# Patient Record
Sex: Female | Born: 1960 | Race: White | Hispanic: No | Marital: Married | Smoking: Never smoker
Health system: Southern US, Community
[De-identification: ages and names within clinical notes are randomized; demographics above are authoritative.]

## PROBLEM LIST (undated history)

## (undated) DIAGNOSIS — Z9889 Other specified postprocedural states: Secondary | ICD-10-CM

## (undated) DIAGNOSIS — T4145XA Adverse effect of unspecified anesthetic, initial encounter: Secondary | ICD-10-CM

## (undated) DIAGNOSIS — R112 Nausea with vomiting, unspecified: Secondary | ICD-10-CM

## (undated) DIAGNOSIS — F419 Anxiety disorder, unspecified: Secondary | ICD-10-CM

## (undated) DIAGNOSIS — R55 Syncope and collapse: Secondary | ICD-10-CM

## (undated) DIAGNOSIS — C801 Malignant (primary) neoplasm, unspecified: Secondary | ICD-10-CM

## (undated) DIAGNOSIS — Z87442 Personal history of urinary calculi: Secondary | ICD-10-CM

## (undated) DIAGNOSIS — J302 Other seasonal allergic rhinitis: Secondary | ICD-10-CM

## (undated) DIAGNOSIS — T8859XA Other complications of anesthesia, initial encounter: Secondary | ICD-10-CM

## (undated) DIAGNOSIS — E162 Hypoglycemia, unspecified: Secondary | ICD-10-CM

## (undated) DIAGNOSIS — S29011A Strain of muscle and tendon of front wall of thorax, initial encounter: Secondary | ICD-10-CM

## (undated) DIAGNOSIS — M8580 Other specified disorders of bone density and structure, unspecified site: Secondary | ICD-10-CM

## (undated) HISTORY — DX: Other seasonal allergic rhinitis: J30.2

## (undated) HISTORY — DX: Malignant (primary) neoplasm, unspecified: C80.1

## (undated) HISTORY — DX: Other specified disorders of bone density and structure, unspecified site: M85.80

## (undated) HISTORY — PX: NASAL SEPTUM SURGERY: SHX37

## (undated) HISTORY — PX: SHOULDER ARTHROSCOPY: SHX128

---

## 1998-05-28 ENCOUNTER — Emergency Department (HOSPITAL_COMMUNITY): Admission: EM | Admit: 1998-05-28 | Discharge: 1998-05-28 | Payer: Self-pay | Admitting: Emergency Medicine

## 2000-09-17 ENCOUNTER — Other Ambulatory Visit: Admission: RE | Admit: 2000-09-17 | Discharge: 2000-09-17 | Payer: Self-pay | Admitting: Gynecology

## 2000-10-23 ENCOUNTER — Encounter: Payer: Self-pay | Admitting: Emergency Medicine

## 2000-10-23 ENCOUNTER — Emergency Department (HOSPITAL_COMMUNITY): Admission: EM | Admit: 2000-10-23 | Discharge: 2000-10-23 | Payer: Self-pay | Admitting: Emergency Medicine

## 2000-10-31 ENCOUNTER — Ambulatory Visit (HOSPITAL_COMMUNITY): Admission: RE | Admit: 2000-10-31 | Discharge: 2000-10-31 | Payer: Self-pay | Admitting: Neurology

## 2001-01-11 ENCOUNTER — Ambulatory Visit (HOSPITAL_COMMUNITY): Admission: RE | Admit: 2001-01-11 | Discharge: 2001-01-11 | Payer: Self-pay | Admitting: Neurology

## 2001-01-11 ENCOUNTER — Encounter: Payer: Self-pay | Admitting: Neurology

## 2001-05-20 ENCOUNTER — Emergency Department (HOSPITAL_COMMUNITY): Admission: EM | Admit: 2001-05-20 | Discharge: 2001-05-20 | Payer: Self-pay | Admitting: Emergency Medicine

## 2001-05-20 ENCOUNTER — Encounter: Payer: Self-pay | Admitting: Emergency Medicine

## 2002-01-01 ENCOUNTER — Encounter: Payer: Self-pay | Admitting: Family Medicine

## 2002-01-01 ENCOUNTER — Ambulatory Visit (HOSPITAL_COMMUNITY): Admission: RE | Admit: 2002-01-01 | Discharge: 2002-01-01 | Payer: Self-pay | Admitting: Family Medicine

## 2002-03-23 ENCOUNTER — Other Ambulatory Visit: Admission: RE | Admit: 2002-03-23 | Discharge: 2002-03-23 | Payer: Self-pay | Admitting: Gynecology

## 2002-03-27 ENCOUNTER — Encounter: Admission: RE | Admit: 2002-03-27 | Discharge: 2002-03-27 | Payer: Self-pay | Admitting: Gynecology

## 2002-03-27 ENCOUNTER — Encounter: Payer: Self-pay | Admitting: Gynecology

## 2002-09-13 ENCOUNTER — Emergency Department (HOSPITAL_COMMUNITY): Admission: EM | Admit: 2002-09-13 | Discharge: 2002-09-13 | Payer: Self-pay | Admitting: Emergency Medicine

## 2002-09-13 ENCOUNTER — Encounter: Payer: Self-pay | Admitting: Emergency Medicine

## 2003-05-19 ENCOUNTER — Other Ambulatory Visit: Admission: RE | Admit: 2003-05-19 | Discharge: 2003-05-19 | Payer: Self-pay | Admitting: Gynecology

## 2003-05-24 ENCOUNTER — Encounter: Payer: Self-pay | Admitting: Gynecology

## 2003-05-24 ENCOUNTER — Encounter: Admission: RE | Admit: 2003-05-24 | Discharge: 2003-05-24 | Payer: Self-pay | Admitting: Gynecology

## 2004-09-01 ENCOUNTER — Other Ambulatory Visit: Admission: RE | Admit: 2004-09-01 | Discharge: 2004-09-01 | Payer: Self-pay | Admitting: Gynecology

## 2005-12-07 ENCOUNTER — Encounter: Admission: RE | Admit: 2005-12-07 | Discharge: 2005-12-07 | Payer: Self-pay | Admitting: Orthopedic Surgery

## 2006-10-01 HISTORY — PX: ABDOMINAL HYSTERECTOMY: SHX81

## 2006-10-11 ENCOUNTER — Encounter: Admission: RE | Admit: 2006-10-11 | Discharge: 2006-10-11 | Payer: Self-pay | Admitting: Obstetrics and Gynecology

## 2007-01-01 ENCOUNTER — Encounter (INDEPENDENT_AMBULATORY_CARE_PROVIDER_SITE_OTHER): Payer: Self-pay | Admitting: Specialist

## 2007-01-01 ENCOUNTER — Inpatient Hospital Stay (HOSPITAL_COMMUNITY): Admission: RE | Admit: 2007-01-01 | Discharge: 2007-01-02 | Payer: Self-pay | Admitting: Obstetrics and Gynecology

## 2007-03-08 ENCOUNTER — Encounter: Admission: RE | Admit: 2007-03-08 | Discharge: 2007-03-08 | Payer: Self-pay | Admitting: Orthopedic Surgery

## 2008-10-01 HISTORY — PX: INCONTINENCE SURGERY: SHX676

## 2009-04-12 ENCOUNTER — Ambulatory Visit (HOSPITAL_BASED_OUTPATIENT_CLINIC_OR_DEPARTMENT_OTHER): Admission: RE | Admit: 2009-04-12 | Discharge: 2009-04-12 | Payer: Self-pay | Admitting: Urology

## 2009-05-24 ENCOUNTER — Encounter: Admission: RE | Admit: 2009-05-24 | Discharge: 2009-05-24 | Payer: Self-pay | Admitting: Obstetrics and Gynecology

## 2010-10-23 ENCOUNTER — Encounter (HOSPITAL_COMMUNITY): Payer: Self-pay | Admitting: Obstetrics and Gynecology

## 2011-01-07 LAB — TYPE AND SCREEN: ABO/RH(D): O POS

## 2011-01-07 LAB — PROTIME-INR
INR: 1 (ref 0.00–1.49)
Prothrombin Time: 13.4 seconds (ref 11.6–15.2)

## 2011-01-07 LAB — CBC
HCT: 38.8 % (ref 36.0–46.0)
MCHC: 34.7 g/dL (ref 30.0–36.0)
RBC: 3.97 MIL/uL (ref 3.87–5.11)

## 2011-01-07 LAB — APTT: aPTT: 30 seconds (ref 24–37)

## 2011-02-13 NOTE — Op Note (Signed)
NAMECORINN, Elizabeth Dominguez             ACCOUNT NO.:  [id number]   MEDICAL RECORD NO.:  [id number]          PATIENT TYPE:  AMB   LOCATION:  NESC                         FACILITY:  [facility]   PHYSICIAN:  Martina Sinner, MD DATE OF BIRTH:  02/24/1961   DATE OF PROCEDURE:  04/12/2009  DATE OF DISCHARGE:  04/12/2009                               OPERATIVE REPORT   PREOPERATIVE DIAGNOSIS:  Stress urinary continence.   POSTOPERATIVE DIAGNOSIS:  Stress urinary continence.   SURGERY:  Sling cystourethropexy [facility]) plus cystoscopy.   Elizabeth Dominguez has stress incontinence and consented to a sling.  The  risks were discussed.  Preoperative lab tests were normal.  Extra care  was taken with leg positioning.   Two 1-cm incisions were made; 1-cm incisions were made 1 fingerbreadth  above the symphysis pubis 1.5-cm lateral to the midline.  A 2-cm  incision was made underlying the mid urethra.  I instilled 7 mL of  lidocaine epinephrine mixture.  I sharply dissected urethrovesical angle  bilaterally.  With the bladder empty, I passed a SPARC needle on top of  and along the back of the symphysis pubis around the pulp of my index  finger bilaterally.   I cystoscoped the patient.  There was excellent jets.  There was no  indentation of bladder with deflection of the trocars.  There was no  injury to bladder or urethra.   With the bladder empty, I attached a SPARC sling and brought up through  the retropubic space.  I tensioned over the fat part of moderate-sized  Kelly clamp.  I cut below the blue dots and removed the sheath.  I was  having position and tension of the sling with no spring-back effect.  There was nice hypermobility.  A 2-0 Vicryl was used in running fashion  followed by 2 interrupted sutures.  Sling was cut at the level of the  skin, and 4-0 Vicryl followed by Dermabond was utilized.  Hopefully,  this operation reaches the patient's treatment goals.  I was very  pleased with  procedure.           ______________________________  Martina Sinner, MD  Electronically Signed     SAM/MEDQ  D:  04/21/2009  T:  04/21/2009  Job:  [id number]

## 2011-02-16 NOTE — Op Note (Signed)
Elizabeth Dominguez, Elizabeth Dominguez             ACCOUNT NO.:  [id number]   MEDICAL RECORD NO.:  [id number]          PATIENT TYPE:  INP   LOCATION:  9302                          FACILITY:  [facility]   PHYSICIAN:  Zelphia Cairo, MD    DATE OF BIRTH:  [birth year]   DATE OF PROCEDURE:  01/01/2007  DATE OF DISCHARGE:  01/02/2007                               OPERATIVE REPORT   PREOPERATIVE DIAGNOSES:  1. Menorrhagia.  2. Cystocele.  3. Rectocele.   POSTOPERATIVE DIAGNOSES:  1. Menorrhagia.  2. Cystocele.  3. Rectocele.   PROCEDURE:  LAVH with anterior and posterior repair.   SURGEON:  Zelphia Cairo, M.D.   ASSISTANT:  Dineen Kid. Rana Snare, M.D.   ANESTHESIA:  General.   SPECIMEN:  Uterus and cervix.   ESTIMATED BLOOD LOSS:  350 mL.   COMPLICATIONS:  None.   URINE OUTPUT:  800 mL.   CONDITION:  Stable and extubated to recovery room.   PROCEDURE:  The patient was taken to the operating room where she was  given general anesthesia.  She was placed in the dorsal lithotomy  position using Allen stirrups.  She was prepped and draped in sterile  fashion, and a Foley catheter was inserted in the bladder.   A bivalve speculum was placed in the vagina and a single-tooth tenaculum  was placed on the anterior lip of the cervix.  A Hulka clamp was  inserted into the uterine cavity and grasped the anterior lip of the  cervix.  The single-tooth tenaculum and speculum were then removed, and  our focus was turned to the abdomen.   A small infraumbilical skin incision was made with the scalpel, and a  blunt trocar was inserted under direct visualization.  Once  intraperitoneal placement was confirmed, the CO2 was turned on and the  abdomen and pelvis were insufflated.  A survey of the abdomen and pelvis  revealed a boggy-appearing uterus, normal-appearing adnexa bilaterally.  The patient was placed in Trendelenburg.  A small 5-mm suprapubic  incision was made with a scalpel, and a 5 mm trocar was inserted  under  direct visualization.  A blunt probe was placed through the 5-mm trocar,  and the bowel was swept out of the cul-de-sac.  The cul-de-sac was free  of any adhesions.  The uterus was then manipulated to one side.  The  right adnexa was grasped and tented up and outward using a blunt  grasper.  The gyrus was used to cauterize and cut the fallopian tube and  utero-ovarian ligament.  Hemostasis was assured.  Serial bites were then  taken down the broad ligament staying just adjacent to the uterus.  The  round ligament was then grasped, cauterized and cut with the gyrus.  Excellent hemostasis was noted.  This procedure was repeated on the  left.  Once hemostasis was assured bilaterally, all instruments were  removed from the abdomen.  The CO2 was turned off, and our attention was  focused on the vagina.   A weighted speculum was placed in the posterior cul-de-sac, a Deaver in  the anterior cul-de-sac, and a single-tooth tenaculum was  used to grasp  the cervix.  A circumferential incision was made around the cervix using  the Bovie.  The posterior cul-de-sac was then entered sharply using Mayo  scissors, and the weighted speculum was placed into the posterior cul-de-  sac.  At this point, the uterosacral ligaments were grasped with the  LigaSure, cauterized, and cut with Mayo scissors.  This procedure was  performed bilaterally.  The bladder was then dissected off of the  pubocervical fascia anteriorly using a 4 x 4 sponge, and the anterior  cul-de-sac was entered sharply using Mayo scissors.  The Deaver was then  placed for retraction in the anterior cul-de-sac.  The uterine arteries  and broad ligaments were then serially clamped using LigaSure,  cauterized, and cut with Mayo scissors.  Hemostasis was assured  bilaterally.  The uterus was then grasped at the fundus using a thyroid  tenaculum and delivered posteriorly through the cul-de-sac.  Bilateral  cornua were then clamped and  cauterized using the LigaSure and then cut  with Mayo scissors.  The uterus was then passed off and sent to  pathology.  The uterosacral ligaments were then reapproximated to  obliterate the cul-de-sac.  The vaginal cuff was then closed vertically  using figure-of-eight stitches of 0 Vicryl.  Once hemostasis was  assured, our attention was then turned to the anterior vagina and  cystocele repair.   An incision was made in the anterior vagina, and the vaginal mucosa was  dissected free of the underlying pubocervical fascia bluntly and using  Metzenbaum scissors.  The fascia was then reapproximated with Vicryl  using interrupted stitches.  The excess vaginal mucosa was then trimmed,  and the anterior vagina was closed using Vicryl in a running locked  fashion.  Our attention was then turned to the posterior vagina.  Two  Allis clamps were placed on the perineal body.  A V-shaped incision was  made using the scalpel.  The vaginal mucosa was then incised in the  midline extending up to the vaginal cuff.  The underlying fascia was  dissected off the vaginal mucosa bluntly and using Metzenbaum scissors.  The fascia was reapproximated using interrupted suture.  The excess  vaginal mucosa was trimmed, and the vaginal mucosa was closed in a  running locked fashion.  Following the procedure, vaginal hemostasis was  assured and our attention was then returned to the abdomen.   The abdomen was reinsufflated.  The camera was inserted, and the abdomen  and pelvis were irrigated.  Once hemostasis was assured by inspecting  all pedicles, all instruments were removed from the abdomen.  The  abdominal incisions were closed using 3-0 Vicryl.   The patient was extubated and taken to the recovery room in stable  condition.      Zelphia Cairo, MD  Electronically Signed     GA/MEDQ  D:  01/06/2007  T:  01/06/2007  Job:  [id number]

## 2011-02-16 NOTE — H&P (Signed)
Elizabeth Dominguez, UNGERER             ACCOUNT NO.:  [id number]   MEDICAL RECORD NO.:  [id number]          PATIENT TYPE:  INP   LOCATION:  9302                          FACILITY:  [facility]   PHYSICIAN:  Zelphia Cairo, MD    DATE OF BIRTH:  01-25-1961   DATE OF ADMISSION:  01/01/2007  DATE OF DISCHARGE:                              HISTORY & PHYSICAL   A 50-year white female presents to the office in December with  complaints of heavy menstrual cycles over the last 5-6 months. She has  noticed increasing menses with large blood clots.  Also increasing  dysmenorrhea and back pain with her periods.  She will have occasional  spotting between periods.  She reports that her menstrual cycles occur  every 2-3 weeks.   PAST MEDICAL HISTORY:  Seasonal disorder.   MEDICATIONS:  Allegra DS, Topamax 200 mg per day.   SURGICAL HISTORY:  Two nasal surgeries.   SOCIAL HISTORY:  Negative for tobacco, alcohol and drug use.   ALLERGIES:  Environmental.   OB HISTORY:  One vaginal delivery.  Her daughter is now 52 years old.  GYN history negative for abnormal Pap smears.  Her last mammogram was 2  years ago.  She is sexually active and denies pain with intercourse.   FAMILY HISTORY:  Her mom was diagnosed with breast cancer in her 61s.  She has had a complete mastectomy.  She is now living at the age of 50.  She denies other GYN or colon cancers.   PHYSICAL EXAM:  Height 5 feet 9 inches, weight 162, blood pressure  108/68, hemoglobin 14.8.  Urine negative.  Head, neck normal.  No  thyromegaly or nodularity.  HEART:  Regular.  Lungs:  Clear.  ABDOMEN:  Soft, nontender, no masses palpated.  PELVIC EXAM:  Reveals normal  external female genitalia.  Vagina and cervix are normal.  No lesions.  Uterus is mobile and nontender.  No adnexal masses are noted.  Rectal  exam negative.   Sonohystogram was performed 10/17/06 showing retroverted uterus with  possible adenomyosis after saline infusion over the 3 mm  thickened  endometrial lining but no intracavitary masses were noted.  These  results were discussed with the patient.  We discussed options including  minor IUD versus endometrial ablation versus hysterectomy.  The patient  opts for hysterectomy.  Risks, benefits and alternatives were discussed  with the patient and informed consent was obtained.      Zelphia Cairo, MD  Electronically Signed    GA/MEDQ  D:  12/31/2006  T:  01/01/2007  Job:  [id number]

## 2011-02-16 NOTE — Consult Note (Signed)
[facility]  Patient:    KORISSA, HORSFORD                   MRN: [id number] Proc. Date: 10/23/00 Attending:  Marlan Palau, M.D. CC:         Willis Modena. Dreiling, M.D.   Consultation Report  HISTORY OF PRESENT ILLNESS:  Elizabeth Dominguez is a 50 year old right-handed white female, born [birth year], with a history of headaches and dizzy episodes.  This patient has been seen previously by Dr. 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  An MR angiogram showed two questionable small areas of signal loss in left vertebral and mid-basilar arteries; these were felt possibly to represent artifact.  Patient was felt possibly to have migraine, was placed on Calan for a period of time, but has been taken off of this. Patient has done well with her headaches and has not had significant problems with this.  Three weeks ago, patient had a syncopal episode that occurred while at home.  Patient was noted to have blanching of the face, felt dizzy, light-headed, hot and cold at the same time, with clammy skin.  Patient felt as if she were going to black out and actually did pass out for a period of time, had some nausea afterwards and took about 30 minutes to get over the episode.  Patient felt as if her heart was racing.  Patient had a recurring episode today that lasted on a few seconds with the episode of syncope. Patient actually was able to get down on the floor but still blacked out. Patient denied any nausea today but felt shortness of breath when she came to on both occasions.  Patient denied any overt chest pain.  Patient feels as if the right face and arm may be tingly following the episodes.  Patient also had some slight headache about two minutes prior to this syncopal event.  Patient may see white spots in front of the eyes as well.  Patient was brought to the emergency room  for an evaluation.  CT scan of the brain done through the emergency room shows no acute changes.  Some minimal changes in the left cerebellar hemisphere are noted.  Neurology was called for further evaluation.  PAST MEDICAL HISTORY 1. History of syncope, as above. 2. History of headache, possibly migraine. 3. History of allergies.  PAST SURGICAL HISTORY:  No surgical history noted.  CURRENT MEDICATIONS:  Patient is currently on Allegra.  ALLERGIES:  Has no known allergies.  SOCIAL HISTORY:  Does not smoke.  Drinks alcohol on occasion.  Patient is married, works as an Producer, television/film/video.  FAMILY MEDICAL HISTORY:  Notable that mother has history of hypertension. Father has Parkinsons disease.  There is Parkinsons in the grandfather. Patient has two brothers and two sisters and two children of her own.  Mother, grandfather and uncle have hypertension and diabetes in a sister; patient, herself, has history of low sugar episodes.  Heart disease in grandfather. Cancer in her mother.  Family history is negative for faints and seizures.  REVIEW OF SYSTEMS:  No recent fevers or chills.  Patient does have occasional headaches.  Does not some occasional shortness of breath and chest pains. Denies any abdominal pains, problems controlling the bowels or bladder. Denies any tongue-biting with the above episodes.  PHYSICAL EXAMINATION  VITALS:  Blood pressure is 114/61.  Heart rate 66 and regular.  Respiratory rate 20.  Temperature:  Afebrile.  GENERAL:  This patient is a minimally obese white female who is alert and cooperative at the time of examination.  HEENT:  Head is atraumatic.  Eyes:  Pupils are equal, round and reactive to light.  Disks are flat bilaterally.  NECK:  Supple.  No carotid bruits are noted.  RESPIRATORY:  Clear to auscultation and percussion.  CARDIOVASCULAR:  Examination reveals a regular rate and rhythm without obvious murmurs or  rubs.  EXTREMITIES:  The extremities are without significant edema.  NEUROLOGIC:  Cranial nerves as above.  Facial symmetry is present.  Patient has good strength to facial muscles and the muscles with head turning and shoulder shrugs bilaterally.  Visual fields are full to double simultaneous stimuli and speech is well-enunciated and not aphasic.  Motor testing reveals 5/5 strength in all fours.  Good symmetric motor tone is noted throughout. Sensory testing is intact to pinprick, soft touch and vibratory sensation throughout.  Deep tendon reflexes are symmetric and normal.  Toes are downgoing bilaterally.  Patient has good and normal finger-to-nose-to-finger and toe-to-finger bilaterally.  Patient was not ambulated.  LABORATORY AND IMAGING FINDINGS:  CT scan of the head done today shows no acute abnormalities.  Some chronic left cerebellar low densities are noted.  Some evidence of a small air-fluid level on the right maxillary sinus is noted.  EKG reveals sinus bradycardia; otherwise, normal EKG.  Heart rate of 57.  IMPRESSION 1. History of recurrent syncope. 2. Possible migraine headache.  This patient has what sounds like a typical simple faint or vasovagal response episode with light-headed sensation, brief syncope, clammy skin, observed blanching of the face, nausea associated with the event and shortness of breath.  This patient likely is having benign episodes but will pursue further followup to exclude the possibility of seizures.  Patient may benefit also from a Holter monitor study.  If the episodes continue, full workup with tilt table testing for neurocardiogenic syncope needs to be entertained.  PLAN 1. EEG studies as an outpatient. 2. Holter monitor study. 3. Consider repeat MRI and MR angiogram in the future. 4. I will have patient follow up through [facility] in the    next two to three weeks. DD:  10/23/00 TD:  10/24/00 Job:  [id number]

## 2011-10-02 HISTORY — PX: HERNIA REPAIR: SHX51

## 2011-10-05 ENCOUNTER — Ambulatory Visit (INDEPENDENT_AMBULATORY_CARE_PROVIDER_SITE_OTHER): Payer: BC Managed Care – PPO

## 2011-10-05 DIAGNOSIS — K429 Umbilical hernia without obstruction or gangrene: Secondary | ICD-10-CM

## 2011-10-15 ENCOUNTER — Ambulatory Visit (INDEPENDENT_AMBULATORY_CARE_PROVIDER_SITE_OTHER): Payer: BC Managed Care – PPO | Admitting: Surgery

## 2011-10-15 ENCOUNTER — Encounter (INDEPENDENT_AMBULATORY_CARE_PROVIDER_SITE_OTHER): Payer: Self-pay | Admitting: Surgery

## 2011-10-15 VITALS — BP 132/96 | HR 68 | Temp 98.2°F | Resp 12 | Ht 69.0 in | Wt 201.6 lb

## 2011-10-15 DIAGNOSIS — K420 Umbilical hernia with obstruction, without gangrene: Secondary | ICD-10-CM | POA: Insufficient documentation

## 2011-10-15 NOTE — Progress Notes (Signed)
Patient ID: Elizabeth Dominguez, female   DOB: 07/11/1961, 51 y.o.   MRN: [id number]  Chief Complaint  Patient presents with  . Other    new pt- eval umb hernia    HPI Elizabeth Dominguez is a 51 y.o. female.   HPIThe patient presents at the request of Dr. Perrin Maltese due to umbilical hernia. She was lifting about a month ago and developed periumbilical pain and a bulge at her umbilicus. The pain is made worse with lifting or straining. It is made better by rest. She has had diarrhea. She has had no constipation, nausea or vomiting. The pain is severe with lifting a 7/10 and sharp with radiation toward the right lower quadrant  Past Medical History  Diagnosis Date  . Cancer     skin  . Hernia     Past Surgical History  Procedure Date  . Nasal septum surgery 1987, 1998  . Abdominal hysterectomy 2008  . Shoulder arthroscopy     left  . Incontinence surgery 2010    Family History  Problem Relation Age of Onset  . Cancer Mother     breast  . Hypertension Mother   . Cancer Father     skin  . Heart disease Maternal Grandfather     Social History History  Substance Use Topics  . Smoking status: Never Smoker   . Smokeless tobacco: Not on file  . Alcohol Use: Yes    Allergies  Allergen Reactions  . Nsaids     Stomach pain    No current outpatient prescriptions on file.    Review of Systems Review of Systems  Constitutional: Negative.   HENT: Negative.   Eyes: Negative.   Respiratory: Negative.   Cardiovascular: Negative.   Gastrointestinal: Positive for abdominal pain.  Genitourinary: Negative.   Musculoskeletal: Negative.   Skin: Negative.   Neurological: Negative.   Hematological: Negative.   Psychiatric/Behavioral: Negative.     Blood pressure 132/96, pulse 68, temperature 98.2 F (36.8 C), temperature source Temporal, resp. rate 12, height 5\' 9"  (1.753 m), weight 201 lb 9.6 oz (91.445 kg).  Physical Exam Physical Exam  Constitutional: She appears  well-developed and well-nourished.  HENT:  Head: Normocephalic and atraumatic.  Eyes: EOM are normal. Pupils are equal, round, and reactive to light.  Neck: Normal range of motion. Neck supple.  Cardiovascular: Normal rate and regular rhythm.   Pulmonary/Chest: Effort normal and breath sounds normal.  Abdominal: Soft. She exhibits no distension.       A small umbilical hernia tender with incarcerated preperitoneal fat  Musculoskeletal: Normal range of motion.  Neurological: She is alert.  Skin: Skin is warm and dry.  Psychiatric: She has a normal mood and affect. Her behavior is normal. Judgment and thought content normal.    Data Reviewed Dr Perrin Maltese notes  Assessment    Umbilical hernia incarcerated    Plan    I recommend repair of her umbilical hernia. Nonoperative options discussed as well. Risks, benefits and alternative therapies discussed at each. She wishes to proceed.The risk of hernia repair include bleeding,  Infection,   Recurrence of the hernia,  Mesh use, chronic pain,  Organ injury,  Bowel injury,  Bladder injury,   nerve injury with numbness around the incision,  Death,  and worsening of preexisting  medical problems.  The alternatives to surgery have been discussed as well..  Long term expectations of both operative and non operative treatments have been discussed.   The patient agrees to  proceed.       Calle Schader A. 10/15/2011, 4:01 PM

## 2011-10-15 NOTE — Patient Instructions (Signed)
No lifting until surgery.

## 2011-10-17 ENCOUNTER — Other Ambulatory Visit (INDEPENDENT_AMBULATORY_CARE_PROVIDER_SITE_OTHER): Payer: Self-pay | Admitting: Surgery

## 2011-10-17 ENCOUNTER — Ambulatory Visit
Admission: RE | Admit: 2011-10-17 | Discharge: 2011-10-17 | Disposition: A | Discharge status: Home / Self Care | Payer: BC Managed Care – PPO | Source: Ambulatory Visit | Attending: Surgery | Admitting: Surgery

## 2011-10-17 DIAGNOSIS — K429 Umbilical hernia without obstruction or gangrene: Secondary | ICD-10-CM

## 2011-10-17 DIAGNOSIS — Z01811 Encounter for preprocedural respiratory examination: Secondary | ICD-10-CM

## 2011-10-18 DIAGNOSIS — K420 Umbilical hernia with obstruction, without gangrene: Secondary | ICD-10-CM

## 2011-10-18 HISTORY — PX: UMBILICAL HERNIA REPAIR: SHX196

## 2011-10-26 ENCOUNTER — Telehealth (INDEPENDENT_AMBULATORY_CARE_PROVIDER_SITE_OTHER): Payer: Self-pay | Admitting: Surgery

## 2011-10-29 NOTE — Telephone Encounter (Signed)
APPOINTMENT DATE IS OK IF PATIENT IS DOING OK/GY

## 2011-11-09 ENCOUNTER — Ambulatory Visit (INDEPENDENT_AMBULATORY_CARE_PROVIDER_SITE_OTHER): Payer: BC Managed Care – PPO | Admitting: Surgery

## 2011-11-09 ENCOUNTER — Encounter (INDEPENDENT_AMBULATORY_CARE_PROVIDER_SITE_OTHER): Payer: Self-pay | Admitting: Surgery

## 2011-11-09 VITALS — BP 122/86 | HR 64 | Temp 97.4°F | Resp 16 | Ht 69.0 in | Wt 200.8 lb

## 2011-11-09 DIAGNOSIS — Z9889 Other specified postprocedural states: Secondary | ICD-10-CM

## 2011-11-09 NOTE — Patient Instructions (Signed)
Resume full activity in 2 weeks.  

## 2011-11-09 NOTE — Progress Notes (Signed)
The patient returns after umbilical hernia repair. She's doing well. She has no complaints.  Exam: Umbilical incision healed well. No redness or signs of infection.  Impression: Status post repair of umbilical hernia no mesh  Plan: Return to full duty in 2 weeks followup as needed.

## 2011-12-06 ENCOUNTER — Encounter (INDEPENDENT_AMBULATORY_CARE_PROVIDER_SITE_OTHER): Payer: Self-pay | Admitting: Surgery

## 2012-12-25 ENCOUNTER — Emergency Department (HOSPITAL_COMMUNITY)
Admission: EM | Admit: 2012-12-25 | Discharge: 2012-12-25 | Disposition: A | Discharge status: Home / Self Care | Payer: Managed Care, Other (non HMO) | Attending: Emergency Medicine | Admitting: Emergency Medicine

## 2012-12-25 ENCOUNTER — Emergency Department (HOSPITAL_COMMUNITY): Payer: Managed Care, Other (non HMO)

## 2012-12-25 ENCOUNTER — Encounter (HOSPITAL_COMMUNITY): Payer: Self-pay | Admitting: Emergency Medicine

## 2012-12-25 DIAGNOSIS — Z8719 Personal history of other diseases of the digestive system: Secondary | ICD-10-CM | POA: Insufficient documentation

## 2012-12-25 DIAGNOSIS — K8021 Calculus of gallbladder without cholecystitis with obstruction: Secondary | ICD-10-CM | POA: Insufficient documentation

## 2012-12-25 DIAGNOSIS — Z85828 Personal history of other malignant neoplasm of skin: Secondary | ICD-10-CM | POA: Insufficient documentation

## 2012-12-25 DIAGNOSIS — R110 Nausea: Secondary | ICD-10-CM | POA: Insufficient documentation

## 2012-12-25 DIAGNOSIS — K8050 Calculus of bile duct without cholangitis or cholecystitis without obstruction: Secondary | ICD-10-CM

## 2012-12-25 DIAGNOSIS — Z90710 Acquired absence of both cervix and uterus: Secondary | ICD-10-CM | POA: Insufficient documentation

## 2012-12-25 LAB — COMPREHENSIVE METABOLIC PANEL
Alkaline Phosphatase: 87 U/L (ref 39–117)
Creatinine, Ser: 0.78 mg/dL (ref 0.50–1.10)
GFR calc Af Amer: >90 mL/min (ref >90)
Potassium: 3.7 mEq/L (ref 3.5–5.1)
Sodium: 136 mEq/L (ref 135–145)
Total Bilirubin: 0.6 mg/dL (ref 0.3–1.2)

## 2012-12-25 LAB — CBC WITH DIFFERENTIAL/PLATELET
Basophils Absolute: 0 10*3/uL (ref 0.0–0.1)
Eosinophils Relative: 1 % (ref 0–5)
Hemoglobin: 13.1 g/dL (ref 12.0–15.0)
Lymphocytes Relative: 16 % (ref 12–46)
Lymphs Abs: 1.3 10*3/uL (ref 0.7–4.0)
Monocytes Absolute: 0.4 10*3/uL (ref 0.1–1.0)
Monocytes Relative: 5 % (ref 3–12)
Platelets: 209 10*3/uL (ref 150–400)
WBC: 8 10*3/uL (ref 4.0–10.5)

## 2012-12-25 LAB — LIPASE, BLOOD: Lipase: 42 U/L (ref 11–59)

## 2012-12-25 MED ORDER — MORPHINE SULFATE 4 MG/ML IJ SOLN
4.0000 mg | Freq: Once | INTRAMUSCULAR | Status: AC
  Administered 2012-12-25: 4 mg via INTRAVENOUS
  Filled 2012-12-25: qty 1

## 2012-12-25 MED ORDER — OXYCODONE-ACETAMINOPHEN 5-325 MG PO TABS: 2.0000 | ORAL_TABLET | ORAL | Status: DC | PRN

## 2012-12-25 MED ORDER — ONDANSETRON HCL 4 MG/2ML IJ SOLN
4.0000 mg | Freq: Once | INTRAMUSCULAR | Status: AC
  Administered 2012-12-25: 4 mg via INTRAVENOUS
  Filled 2012-12-25: qty 2

## 2012-12-25 MED ORDER — SODIUM CHLORIDE 0.9 % IV BOLUS (SEPSIS)
1000.0000 mL | Freq: Once | INTRAVENOUS | Status: AC
  Administered 2012-12-25: 1000 mL via INTRAVENOUS

## 2012-12-25 MED ORDER — GI COCKTAIL ~~LOC~~
30.0000 mL | Freq: Once | ORAL | Status: AC
  Administered 2012-12-25: 30 mL via ORAL
  Filled 2012-12-25: qty 30

## 2012-12-25 NOTE — ED Notes (Signed)
[id number]> Expected date:<BR> Expected time:<BR> Means of arrival:<BR> Comments:<BR> abd pain

## 2012-12-25 NOTE — ED Notes (Signed)
Pt states that she ate [employer] today around noon, which she normal doesn't eat that type of food.  Pt started having mid abd pain thought it was gas but lower then where she normally has gas pains.  Pt took Pepcid but didn't help.  Rates pain 7/10.

## 2012-12-25 NOTE — ED Provider Notes (Signed)
History     CSN: [id number]  Arrival date & time 12/25/12  1340   First MD Initiated Contact with Patient 12/25/12 1341      Chief Complaint  Patient presents with  . Abdominal Pain    (Consider location/radiation/quality/duration/timing/severity/associated sxs/prior treatment) The history is provided by the patient.  Elizabeth Dominguez is a 52 y.o. female history of hysterectomy for vaginal bleeding, abdominal hernia that was repaired here presenting with abdominal pain and distention. She had [employer] around noon with fries and hamburgers. For 30 minutes later she had epigastric pain that she attributed to gas. She tried to walk around take some Pepcid but has not improved. The pain does not radiate. And she feel nauseous but she does not vomit. No fevers or chills. Came in by EMS for evaluation of abdominal pain. No urinary symptoms or constipation or diarrhea.   Past Medical History  Diagnosis Date  . Cancer     skin  . Hernia     Past Surgical History  Procedure Laterality Date  . Nasal septum surgery  1987, 1998  . Abdominal hysterectomy  2008  . Shoulder arthroscopy      left  . Incontinence surgery  2010  . Umbilical hernia repair  10/18/11    Family History  Problem Relation Age of Onset  . Cancer Mother     breast  . Hypertension Mother   . Cancer Father     skin  . Heart disease Maternal Grandfather     History  Substance Use Topics  . Smoking status: Never Smoker   . Smokeless tobacco: Not on file  . Alcohol Use: Yes    OB History   Grav Para Term Preterm Abortions TAB SAB Ect Mult Living                  Review of Systems  Gastrointestinal: Positive for nausea and abdominal pain.  All other systems reviewed and are negative.    Allergies  Nsaids  Home Medications   Current Outpatient Rx  Name  Route  Sig  Dispense  Refill  . acetaminophen (TYLENOL) 500 MG tablet   Oral   Take 1,000 mg by mouth every 6 (six) hours as needed for  pain.         Marland Kitchen diclofenac sodium (VOLTAREN) 1 % GEL   Topical   Apply 2 g topically 4 (four) times daily as needed (for joint pain.).         Marland Kitchen Multiple Vitamin (MULTIVITAMIN WITH MINERALS) TABS   Oral   Take 1 tablet by mouth daily.           There were no vitals taken for this visit.  Physical Exam  Nursing note and vitals reviewed. Constitutional: She is oriented to person, place, and time. She appears well-developed and well-nourished.  Uncomfortable   HENT:  Head: Normocephalic.  Mouth/Throat: Oropharynx is clear and moist.  Eyes: Conjunctivae are normal. Pupils are equal, round, and reactive to light.  Neck: Normal range of motion. Neck supple.  Cardiovascular: Normal rate, regular rhythm and normal heart sounds.   Pulmonary/Chest: Effort normal and breath sounds normal. No respiratory distress. She has no wheezes. She has no rales.  Abdominal: Soft. Bowel sounds are normal.  + RUQ tenderness, mild murphy's sign. Mild epigastric tenderness, no rebound   Musculoskeletal: Normal range of motion.  Neurological: She is alert and oriented to person, place, and time.  Skin: Skin is warm and dry.  Psychiatric:  She has a normal mood and affect. Her behavior is normal. Judgment and thought content normal.    ED Course  Procedures (including critical care time)  Labs Reviewed  CBC WITH DIFFERENTIAL - Abnormal; Notable for the following:    Neutrophils Relative 78 (*)    All other components within normal limits  COMPREHENSIVE METABOLIC PANEL - Abnormal; Notable for the following:    Glucose, Bld 116 (*)    All other components within normal limits  LIPASE, BLOOD   US Abdomen Complete  12/25/2012  *RADIOLOGY REPORT*  Clinical Data:  Right upper quadrant pain  ABDOMINAL ULTRASOUND COMPLETE  Comparison:  None.  Findings:  Gallbladder:  Multiple gallstones are present.  The largest is 1.2 cm.  No wall thickening or pericholecystic fluid.  No Murphy's sign.  Common Bile  Duct:  The common bile duct is mildly dilated at 6.6 mm.  Liver: No focal mass lesion identified.  Within normal limits in parenchymal echogenicity.  IVC:  Appears normal.  Pancreas:  Obscured by overlying bowel gas.  Spleen:  Within normal limits in size and echotexture.  Right kidney:  Normal in size and parenchymal echogenicity.  No evidence of mass or hydronephrosis.  Left kidney:  Normal in size and parenchymal echogenicity.  No evidence of mass or hydronephrosis.  Abdominal Aorta:  No aneurysm identified.  IMPRESSION: Cholelithiasis.  Mild dilatation of the common bile duct.  Correlate with liver function tests as for the possibility of biliary obstruction.   Original Report Authenticated By: Jolaine Click, M.D.    Dg Abd Acute W/chest  12/25/2012  *RADIOLOGY REPORT*  Clinical Data: 52 year old female sharp abdominal burning and pain.  ACUTE ABDOMEN SERIES (ABDOMEN 2 VIEW & CHEST 1 VIEW)  Comparison: Chest radiograph 10/17/2011.  Findings: Stable and normal lung volumes. Normal cardiac size and mediastinal contours.  Visualized tracheal air column is within normal limits.  The lungs are clear.  No pneumothorax or pneumoperitoneum.  Numerous calcified gallstones in the right upper quadrant, individually measuring approximately 10-15 mm. Nonobstructed bowel gas pattern.  Some small bowel air-fluid levels are present, but there are no dilated loops and gas is present the rectum.  Pelvic phleboliths.  Mild scoliosis. No acute osseous abnormality identified.  IMPRESSION: 1.  Extensive cholelithiasis.  Individual gallstones measure up to approximately 15 mm. 2. Nonobstructed bowel gas pattern, no free air. 3. No acute cardiopulmonary abnormality.   Original Report Authenticated By: Erskine Speed, M.D.      No diagnosis found.    MDM  DASHANIQUE BROWNSTEIN is a 52 y.o. female here with RUQ pain. Will get [place] RUQ, labs, lipase. Will get xray to r/o SBO given previous surgeries.   3:16 PM Xray showed no SBO. US  showed cholelithiasis no cholecystitis. CBD mildly dilated but LFTs nl. I doubt CBD obstruction. Pain improved with morphine. I think she likely has biliary colic with fatty food. Recommend outpatient surgery eval and avoid fatty foods. Will give short course of percocet as needed. Return precautions given.        Richardean Canal, MD 12/25/12 [phone]

## 2012-12-25 NOTE — ED Notes (Signed)
Ultrasound at bedside

## 2013-01-05 ENCOUNTER — Encounter (HOSPITAL_COMMUNITY): Payer: Self-pay | Admitting: Pharmacy Technician

## 2013-01-05 ENCOUNTER — Encounter (INDEPENDENT_AMBULATORY_CARE_PROVIDER_SITE_OTHER): Payer: Self-pay | Admitting: Surgery

## 2013-01-05 ENCOUNTER — Ambulatory Visit (INDEPENDENT_AMBULATORY_CARE_PROVIDER_SITE_OTHER): Payer: Managed Care, Other (non HMO) | Admitting: Surgery

## 2013-01-05 VITALS — BP 132/84 | HR 71 | Temp 97.4°F | Resp 16 | Ht 69.0 in | Wt 190.2 lb

## 2013-01-05 DIAGNOSIS — K8050 Calculus of bile duct without cholangitis or cholecystitis without obstruction: Secondary | ICD-10-CM

## 2013-01-05 DIAGNOSIS — K8020 Calculus of gallbladder without cholecystitis without obstruction: Secondary | ICD-10-CM

## 2013-01-05 NOTE — Patient Instructions (Signed)
Cholelithiasis Cholelithiasis (also called gallstones) is a form of gallbladder disease where gallstones form in your gallbladder. The gallbladder is a non-essential organ that stores bile made in the liver, which helps digest fats. Gallstones begin as small crystals and slowly grow into stones. Gallstone pain occurs when the gallbladder spasms, and a gallstone is blocking the duct. Pain can also occur when a stone passes out of the duct.  Women are more likely to develop gallstones than men. Other factors that increase the risk of gallbladder disease are:  Having multiple pregnancies. Physicians sometimes advise removing diseased gallbladders before future pregnancies.  Obesity.  Diets heavy in fried foods and fat.  Increasing age (older than 83).  Prolonged use of medications containing female hormones.  Diabetes mellitus.  Rapid weight loss.  Family history of gallstones (heredity). SYMPTOMS  Feeling sick to your stomach (nauseous).  Abdominal pain.  Yellowing of the skin (jaundice).  Sudden pain. It may persist from several minutes to several hours.  Worsening pain with deep breathing or when jarred.  Fever.  Tenderness to the touch. In some cases, when gallstones do not move into the bile duct, people have no pain or symptoms. These are called "silent" gallstones. TREATMENT In severe cases, emergency surgery may be required. HOME CARE INSTRUCTIONS   Only take over-the-counter or prescription medicines for pain, discomfort, or fever as directed by your caregiver.  Follow a low-fat diet until seen again. Fat causes the gallbladder to contract, which can result in pain.  Follow up as instructed. Attacks are almost always recurrent and surgery is usually required for permanent treatment. SEEK IMMEDIATE MEDICAL CARE IF:   Your pain increases and is not controlled by medications.  You have an oral temperature above 102 F (38.9 C), not controlled by medication.  You  develop nausea and vomiting. MAKE SURE YOU:   Understand these instructions.  Will watch your condition.  Will get help right away if you are not doing well or get worse. Document Released: 09/13/2005 Document Revised: 12/10/2011 Document Reviewed: 11/16/2010 [facility] Patient Information 2013 [place], [place]. Laparoscopic Cholecystectomy Laparoscopic cholecystectomy is surgery to remove the gallbladder. The gallbladder is located slightly to the right of center in the abdomen, behind the liver. It is a concentrating and storage sac for the bile produced in the liver. Bile aids in the digestion and absorption of fats. Gallbladder disease (cholecystitis) is an inflammation of your gallbladder. This condition is usually caused by a buildup of gallstones (cholelithiasis) in your gallbladder. Gallstones can block the flow of bile, resulting in inflammation and pain. In severe cases, emergency surgery may be required. When emergency surgery is not required, you will have time to prepare for the procedure. Laparoscopic surgery is an alternative to open surgery. Laparoscopic surgery usually has a shorter recovery time. Your common bile duct may also need to be examined and explored. Your caregiver will discuss this with you if he or she feels this should be done. If stones are found in the common bile duct, they may be removed. LET YOUR CAREGIVER KNOW ABOUT:  Allergies to food or medicine.  Medicines taken, including vitamins, herbs, eyedrops, over-the-counter medicines, and creams.  Use of steroids (by mouth or creams).  Previous problems with anesthetics or numbing medicines.  History of bleeding problems or blood clots.  Previous surgery.  Other health problems, including diabetes and kidney problems.  Possibility of pregnancy, if this applies. RISKS AND COMPLICATIONS All surgery is associated with risks. Some problems that may occur following  this procedure include:  Infection.  Damage  to the common bile duct, nerves, arteries, veins, or other internal organs such as the stomach or intestines.  Bleeding.  A stone may remain in the common bile duct. BEFORE THE PROCEDURE  Do not take aspirin for 3 days prior to surgery or blood thinners for 1 week prior to surgery.  Do not eat or drink anything after midnight the night before surgery.  Let your caregiver know if you develop a cold or other infectious problem prior to surgery.  You should be present 60 minutes before the procedure or as directed. PROCEDURE  You will be given medicine that makes you sleep (general anesthetic). When you are asleep, your surgeon will make several small cuts (incisions) in your abdomen. One of these incisions is used to insert a small, lighted scope (laparoscope) into the abdomen. The laparoscope helps the surgeon see into your abdomen. Carbon dioxide gas will be pumped into your abdomen. The gas allows more room for the surgeon to perform your surgery. Other operating instruments are inserted through the other incisions. Laparoscopic procedures may not be appropriate when:  There is major scarring from previous surgery.  The gallbladder is extremely inflamed.  There are bleeding disorders or unexpected cirrhosis of the liver.  A pregnancy is near term.  Other conditions make the laparoscopic procedure impossible. If your surgeon feels it is not safe to continue with a laparoscopic procedure, he or she will perform an open abdominal procedure. In this case, the surgeon will make an incision to open the abdomen. This gives the surgeon a larger view and field to work within. This may allow the surgeon to perform procedures that sometimes cannot be performed with a laparoscope alone. Open surgery has a longer recovery time. AFTER THE PROCEDURE  You will be taken to the recovery area where a nurse will watch and check your progress.  You may be allowed to go home the same day.  Do not resume  physical activities until directed by your caregiver.  You may resume a normal diet and activities as directed. Document Released: 09/17/2005 Document Revised: 12/10/2011 Document Reviewed: 03/02/2011 [facility] Patient Information 2013 [place], [place].

## 2013-01-05 NOTE — Progress Notes (Signed)
Patient ID: Elizabeth Dominguez, female   DOB: 06/30/1961, 52 y.o.   MRN: [id number]  Chief Complaint  Patient presents with  . New Evaluation    eval gallbladder    HPI Elizabeth Dominguez is a 52 y.o. female.  Patient presents with chief complaint of epigastric pain after eating. She was seen in emergency room week ago after a flareup of severe epigastric pain with nausea and vomiting after eating a fatty meal. Ultrasound showed gallstones. The pain is resolved. She reports a long-standing issue with eating fatty foods and epigastric pain. HPI  Past Medical History  Diagnosis Date  . Cancer     skin  . Hernia     Past Surgical History  Procedure Laterality Date  . Nasal septum surgery  1987, 1998  . Abdominal hysterectomy  2008  . Shoulder arthroscopy      left  . Incontinence surgery  2010  . Umbilical hernia repair  10/18/11  . Hernia repair  10/2011    umb hernia repair    Family History  Problem Relation Age of Onset  . Hypertension Mother   . Cancer Mother 88    breast  . Cancer Father     skin  . Parkinson's disease Father   . Heart disease Maternal Grandfather   . Diabetes Sister     Social History History  Substance Use Topics  . Smoking status: Never Smoker   . Smokeless tobacco: Not on file  . Alcohol Use: Yes     Comment: occ    Allergies  Allergen Reactions  . Nsaids     Stomach pain    Current Outpatient Prescriptions  Medication Sig Dispense Refill  . acetaminophen (TYLENOL) 500 MG tablet Take 1,000 mg by mouth every 6 (six) hours as needed for pain.      Marland Kitchen diclofenac sodium (VOLTAREN) 1 % GEL Apply 2 g topically 4 (four) times daily as needed (for joint pain.).      Marland Kitchen Multiple Vitamin (MULTIVITAMIN WITH MINERALS) TABS Take 1 tablet by mouth daily.       No current facility-administered medications for this visit.    Review of Systems Review of Systems  HENT: Negative.   Eyes: Negative.   Respiratory: Negative.   Cardiovascular:  Negative.   Gastrointestinal: Positive for abdominal pain.  Endocrine: Negative.   Genitourinary: Negative.   Allergic/Immunologic: Negative.   Neurological: Negative.   Hematological: Bruises/bleeds easily.  Psychiatric/Behavioral: Negative.     Blood pressure 132/84, pulse 71, temperature 97.4 F (36.3 C), temperature source Temporal, resp. rate 16, height 5\' 9"  (1.753 m), weight 190 lb 3.2 oz (86.274 kg), SpO2 98.00%.  Physical Exam Physical Exam  Constitutional: She is oriented to person, place, and time. She appears well-developed and well-nourished.  HENT:  Head: Normocephalic and atraumatic.  Eyes: EOM are normal. Pupils are equal, round, and reactive to light.  Neck: Normal range of motion. Neck supple.  Cardiovascular: Normal rate and regular rhythm.   Pulmonary/Chest: Effort normal and breath sounds normal.  Abdominal: Soft. Bowel sounds are normal. She exhibits no distension. There is no tenderness.  Musculoskeletal: Normal range of motion.  Neurological: She is alert and oriented to person, place, and time.  Skin: Skin is warm and dry.  Psychiatric: She has a normal mood and affect. Her behavior is normal. Thought content normal.    Data Reviewed Clinical Data: Right upper quadrant pain  ABDOMINAL ULTRASOUND COMPLETE  Comparison: None.  Findings:  Gallbladder: Multiple gallstones  are present. The largest is 1.2  cm. No wall thickening or pericholecystic fluid. No Murphy's  sign.  Common Bile Duct: The common bile duct is mildly dilated at 6.6  mm.  Liver: No focal mass lesion identified. Within normal limits in  parenchymal echogenicity.  IVC: Appears normal.  Pancreas: Obscured by overlying bowel gas.  Spleen: Within normal limits in size and echotexture.  Right kidney: Normal in size and parenchymal echogenicity. No  evidence of mass or hydronephrosis.  Left kidney: Normal in size and parenchymal echogenicity. No  evidence of mass or hydronephrosis.   Abdominal Aorta: No aneurysm identified.  IMPRESSION:  Cholelithiasis.  Mild dilatation of the common bile duct. Correlate with liver  function tests as for the possibility of biliary obstruction.  Original Report Authenticated By: Jolaine Click, M.D.    Assessment    Symptomatic cholelithiasis    Plan    Laparoscopic cholecystectomy with cholangiogram.  The procedure has been discussed with the patient. Operative and non operative treatments have been discussed. Risks of surgery include bleeding, infection,  Common bile duct injury,  Injury to the stomach,liver, colon,small intestine, abdominal wall,  Diaphragm,  Major blood vessels,  And the need for an open procedure.  Other risks include worsening of medical problems, death,  DVT and pulmonary embolism, and cardiovascular events.   Medical options have also been discussed. The patient has been informed of long term expectations of surgery and non surgical options,  The patient agrees to proceed.         Kesler Wickham A. 01/05/2013, 10:08 AM

## 2013-01-08 ENCOUNTER — Encounter (HOSPITAL_COMMUNITY)
Admission: RE | Admit: 2013-01-08 | Discharge: 2013-01-08 | Disposition: A | Discharge status: Home / Self Care | Payer: Managed Care, Other (non HMO) | Source: Ambulatory Visit | Attending: Surgery | Admitting: Surgery

## 2013-01-08 ENCOUNTER — Encounter (HOSPITAL_COMMUNITY): Payer: Self-pay

## 2013-01-08 HISTORY — DX: Adverse effect of unspecified anesthetic, initial encounter: T41.45XA

## 2013-01-08 HISTORY — DX: Syncope and collapse: R55

## 2013-01-08 HISTORY — DX: Anxiety disorder, unspecified: F41.9

## 2013-01-08 HISTORY — DX: Hypoglycemia, unspecified: E16.2

## 2013-01-08 HISTORY — DX: Other complications of anesthesia, initial encounter: T88.59XA

## 2013-01-08 NOTE — Pre-Procedure Instructions (Signed)
CBC, DIFF, CMET, LIPASE AND ONE VIEW CXR REPORTS ARE IN EPIC FROM 12/25/12 AND OK TO USE FOR PT'S LAP CHOLE SURGERY. PT DOES NOT NEED EKG PER ANESTHESIOLOGIST'S GUIDELINES. PT, PTT WERE DRAWN TODAY - AS PER ORDER DR. Luisa Hart.

## 2013-01-08 NOTE — Patient Instructions (Addendum)
YOUR SURGERY IS SCHEDULED AT [facility]  ON:  WED 4/16  REPORT TO [facility] AT:  6:30 AM      PHONE # FOR SHORT STAY IS [phone]  DO NOT EAT OR DRINK ANYTHING AFTER MIDNIGHT THE NIGHT BEFORE YOUR SURGERY.  YOU MAY BRUSH YOUR TEETH, RINSE OUT YOUR MOUTH--BUT NO WATER, NO FOOD, NO CHEWING GUM, NO MINTS, NO CANDIES, NO CHEWING TOBACCO.  PLEASE TAKE THE FOLLOWING MEDICATIONS THE AM OF YOUR SURGERY WITH A FEW SIPS OF WATER:  PEPCID    DO NOT BRING VALUABLES, MONEY, CREDIT CARDS.  DO NOT WEAR JEWELRY, MAKE-UP, NAIL POLISH AND NO METAL PINS OR CLIPS IN YOUR HAIR. CONTACT LENS, DENTURES / PARTIALS, GLASSES SHOULD NOT BE WORN TO SURGERY AND IN MOST CASES-HEARING AIDS WILL NEED TO BE REMOVED.  BRING YOUR GLASSES CASE, ANY EQUIPMENT NEEDED FOR YOUR CONTACT LENS. FOR PATIENTS ADMITTED TO THE HOSPITAL--CHECK OUT TIME THE DAY OF DISCHARGE IS 11:00 AM.  ALL INPATIENT ROOMS ARE PRIVATE - WITH BATHROOM, TELEPHONE, TELEVISION AND WIFI INTERNET.  IF YOU ARE BEING DISCHARGED THE SAME DAY OF YOUR SURGERY--YOU CAN NOT DRIVE YOURSELF HOME--AND SHOULD NOT GO HOME ALONE BY TAXI OR BUS.  NO DRIVING OR OPERATING MACHINERY FOR 24 HOURS FOLLOWING ANESTHESIA / PAIN MEDICATIONS.  PLEASE MAKE ARRANGEMENTS FOR SOMEONE TO BE WITH YOU AT HOME THE FIRST 24 HOURS AFTER SURGERY. RESPONSIBLE DRIVER'S NAME  DAVID Mullenax - PT'S HUSBAND                                               PHONE #  [phone]                             PLEASE READ OVER ANY  FACT SHEETS THAT YOU WERE GIVEN: MRSA INFORMATION, BLOOD TRANSFUSION INFORMATION, INCENTIVE SPIROMETER INFORMATION. FAILURE TO FOLLOW THESE INSTRUCTIONS MAY RESULT IN THE CANCELLATION OF YOUR SURGERY.   PATIENT SIGNATURE_________________________________

## 2013-01-12 ENCOUNTER — Encounter (INDEPENDENT_AMBULATORY_CARE_PROVIDER_SITE_OTHER): Payer: Self-pay | Admitting: General Surgery

## 2013-01-12 ENCOUNTER — Ambulatory Visit (INDEPENDENT_AMBULATORY_CARE_PROVIDER_SITE_OTHER): Payer: Managed Care, Other (non HMO) | Admitting: General Surgery

## 2013-01-12 VITALS — BP 122/80 | HR 76 | Temp 97.6°F | Resp 16 | Ht 69.0 in | Wt 189.0 lb

## 2013-01-12 DIAGNOSIS — K8020 Calculus of gallbladder without cholecystitis without obstruction: Secondary | ICD-10-CM

## 2013-01-12 NOTE — Patient Instructions (Signed)

## 2013-01-13 DIAGNOSIS — K8020 Calculus of gallbladder without cholecystitis without obstruction: Secondary | ICD-10-CM | POA: Insufficient documentation

## 2013-01-13 NOTE — Assessment & Plan Note (Signed)
Pt having fear of eating and continued attacks.   She would like gallbladder taken out as soon as possible.  Since Dr. Luisa Hart is out of town, I have agreed to do it.  We will plan to do Thursday. Reviewed surgery, risks and benefits.

## 2013-01-13 NOTE — Progress Notes (Signed)
HISTORY: Pt had gallbladder surgery planned this Wednesday, but this was inadvertently scheduled while Dr. Cornett is out of town.  The patient is having significant pain attacks on a daily basis, although she is trying to avoid dairy, greasy food, meats, and anything she thinks may cause her GI distress and/or pain.  She does not want to wait an additional week.  She is concerned that she may be having some symptoms at her hernia site again.     PERTINENT REVIEW OF SYSTEMS: Otherwise negative.     EXAM: Head: Normocephalic and atraumatic.  Eyes:  Conjunctivae are normal. Pupils are equal, round, and reactive to light. No scleral icterus.  Neck:  Normal range of motion. Neck supple. No tracheal deviation present. No thyromegaly present.  Resp: No respiratory distress, normal effort. Abd:  Abdomen is soft, non distended.  Mild tenderness in RUQ. No masses are palpable.  There is no rebound and no guarding.  Neurological: Alert and oriented to person, place, and time. Coordination normal.  Skin: Skin is warm and dry. No rash noted. No diaphoretic. No erythema. No pallor.  Psychiatric: Normal mood and affect. Normal behavior. Judgment and thought content normal.      ASSESSMENT AND PLAN:   Symptomatic cholelithiasis Pt having fear of eating and continued attacks.   She would like gallbladder taken out as soon as possible.  Since Dr. Cornett is out of town, I have agreed to do it.  We will plan to do Thursday. Reviewed surgery, risks and benefits.    Pt had repair of umbilical hernia last year.  Will do Optiview port and look at umbilicus.  May use 5 total ports.      Norene Oliveri L Elizah Mierzwa, MD Surgical Oncology, General & Endocrine Surgery [facility]  No PCP Per Patient No ref. provider found   

## 2013-01-14 ENCOUNTER — Ambulatory Visit (HOSPITAL_COMMUNITY): Admission: RE | Admit: 2013-01-14 | Payer: Managed Care, Other (non HMO) | Source: Ambulatory Visit | Admitting: Surgery

## 2013-01-14 ENCOUNTER — Encounter (HOSPITAL_COMMUNITY): Admission: RE | Payer: Self-pay | Source: Ambulatory Visit

## 2013-01-14 SURGERY — LAPAROSCOPIC CHOLECYSTECTOMY WITH INTRAOPERATIVE CHOLANGIOGRAM
Anesthesia: General

## 2013-01-15 ENCOUNTER — Inpatient Hospital Stay (HOSPITAL_COMMUNITY)
Admission: EM | Admit: 2013-01-15 | Discharge: 2013-01-17 | DRG: 418 | Disposition: A | Discharge status: Home / Self Care | Payer: Managed Care, Other (non HMO) | Attending: General Surgery | Admitting: General Surgery

## 2013-01-15 ENCOUNTER — Encounter (HOSPITAL_COMMUNITY): Payer: Self-pay | Admitting: *Deleted

## 2013-01-15 ENCOUNTER — Ambulatory Visit (HOSPITAL_COMMUNITY): Payer: Managed Care, Other (non HMO)

## 2013-01-15 ENCOUNTER — Telehealth (INDEPENDENT_AMBULATORY_CARE_PROVIDER_SITE_OTHER): Payer: Self-pay | Admitting: General Surgery

## 2013-01-15 ENCOUNTER — Encounter (HOSPITAL_COMMUNITY)
Admission: RE | Disposition: A | Discharge status: Home / Self Care | Payer: Self-pay | Source: Ambulatory Visit | Attending: General Surgery

## 2013-01-15 ENCOUNTER — Ambulatory Visit (HOSPITAL_COMMUNITY): Payer: Managed Care, Other (non HMO) | Admitting: *Deleted

## 2013-01-15 ENCOUNTER — Emergency Department (HOSPITAL_COMMUNITY): Payer: Managed Care, Other (non HMO)

## 2013-01-15 ENCOUNTER — Ambulatory Visit (HOSPITAL_COMMUNITY)
Admission: RE | Admit: 2013-01-15 | Discharge: 2013-01-15 | Disposition: A | Discharge status: Home / Self Care | Payer: Managed Care, Other (non HMO) | Source: Ambulatory Visit | Attending: General Surgery | Admitting: General Surgery

## 2013-01-15 ENCOUNTER — Encounter (HOSPITAL_COMMUNITY): Payer: Self-pay | Admitting: Emergency Medicine

## 2013-01-15 DIAGNOSIS — K8020 Calculus of gallbladder without cholecystitis without obstruction: Principal | ICD-10-CM | POA: Diagnosis present

## 2013-01-15 DIAGNOSIS — F410 Panic disorder [episodic paroxysmal anxiety] without agoraphobia: Secondary | ICD-10-CM | POA: Diagnosis present

## 2013-01-15 DIAGNOSIS — K8010 Calculus of gallbladder with chronic cholecystitis without obstruction: Secondary | ICD-10-CM

## 2013-01-15 DIAGNOSIS — J939 Pneumothorax, unspecified: Secondary | ICD-10-CM

## 2013-01-15 DIAGNOSIS — J9383 Other pneumothorax: Secondary | ICD-10-CM | POA: Diagnosis not present

## 2013-01-15 DIAGNOSIS — Z9089 Acquired absence of other organs: Secondary | ICD-10-CM

## 2013-01-15 HISTORY — PX: CHOLECYSTECTOMY: SHX55

## 2013-01-15 LAB — CBC WITH DIFFERENTIAL/PLATELET
Basophils Absolute: 0 10*3/uL (ref 0.0–0.1)
Basophils Relative: 0 % (ref 0–1)
Eosinophils Absolute: 0 K/uL (ref 0.0–0.7)
Eosinophils Relative: 0 % (ref 0–5)
HCT: 36 % (ref 36.0–46.0)
Hemoglobin: 12.8 g/dL (ref 12.0–15.0)
Lymphocytes Relative: 4 % — ABNORMAL LOW (ref 12–46)
Lymphs Abs: 0.4 K/uL — ABNORMAL LOW (ref 0.7–4.0)
MCH: 33.1 pg (ref 26.0–34.0)
MCHC: 35.6 g/dL (ref 30.0–36.0)
MCV: 93 fL (ref 78.0–100.0)
Monocytes Absolute: 0.1 10*3/uL (ref 0.1–1.0)
Monocytes Relative: 1 % — ABNORMAL LOW (ref 3–12)
Neutro Abs: 8.6 10*3/uL — ABNORMAL HIGH (ref 1.7–7.7)
Neutrophils Relative %: 95 % — ABNORMAL HIGH (ref 43–77)
Platelets: 183 10*3/uL (ref 150–400)
RBC: 3.87 MIL/uL (ref 3.87–5.11)
RDW: 12.1 % (ref 11.5–15.5)
WBC: 9.1 K/uL (ref 4.0–10.5)

## 2013-01-15 LAB — COMPREHENSIVE METABOLIC PANEL
AST: 51 U/L — ABNORMAL HIGH (ref 0–37)
Albumin: 3.8 g/dL (ref 3.5–5.2)
Calcium: 9.2 mg/dL (ref 8.4–10.5)
Chloride: 101 mEq/L (ref 96–112)
Creatinine, Ser: 0.76 mg/dL (ref 0.50–1.10)

## 2013-01-15 LAB — COMPREHENSIVE METABOLIC PANEL WITH GFR
ALT: 29 U/L (ref 0–35)
Alkaline Phosphatase: 82 U/L (ref 39–117)
BUN: 11 mg/dL (ref 6–23)
CO2: 27 meq/L (ref 19–32)
GFR calc Af Amer: >90 mL/min (ref >90)
GFR calc non Af Amer: >90 mL/min (ref >90)
Glucose, Bld: 179 mg/dL — ABNORMAL HIGH (ref 70–99)
Potassium: 3.9 meq/L (ref 3.5–5.1)
Sodium: 136 meq/L (ref 135–145)
Total Bilirubin: 0.7 mg/dL (ref 0.3–1.2)
Total Protein: 6.5 g/dL (ref 6.0–8.3)

## 2013-01-15 LAB — URINE MICROSCOPIC-ADD ON

## 2013-01-15 LAB — URINALYSIS, ROUTINE W REFLEX MICROSCOPIC
Bilirubin Urine: NEGATIVE
Glucose, UA: NEGATIVE mg/dL
Ketones, ur: 15 mg/dL — AB
Leukocytes, UA: NEGATIVE
Nitrite: NEGATIVE
Protein, ur: NEGATIVE mg/dL
Specific Gravity, Urine: 1.014 (ref 1.005–1.030)
Urobilinogen, UA: 0.2 mg/dL (ref 0.0–1.0)
pH: 7 (ref 5.0–8.0)

## 2013-01-15 LAB — LIPASE, BLOOD: Lipase: 20 U/L (ref 11–59)

## 2013-01-15 SURGERY — LAPAROSCOPIC CHOLECYSTECTOMY WITH INTRAOPERATIVE CHOLANGIOGRAM
Anesthesia: General | Site: Abdomen | Wound class: Clean Contaminated

## 2013-01-15 MED ORDER — HYDROMORPHONE HCL PF 1 MG/ML IJ SOLN
0.2500 mg | INTRAMUSCULAR | Status: DC | PRN
  Administered 2013-01-15 (×4): 0.5 mg via INTRAVENOUS

## 2013-01-15 MED ORDER — NEOSTIGMINE METHYLSULFATE 1 MG/ML IJ SOLN
INTRAMUSCULAR | Status: DC | PRN
  Administered 2013-01-15: 4 mg via INTRAVENOUS

## 2013-01-15 MED ORDER — ONDANSETRON HCL 4 MG/2ML IJ SOLN
4.0000 mg | Freq: Once | INTRAMUSCULAR | Status: AC
  Administered 2013-01-15: 4 mg via INTRAVENOUS
  Filled 2013-01-15: qty 2

## 2013-01-15 MED ORDER — FAMOTIDINE 10 MG PO TABS
10.0000 mg | ORAL_TABLET | Freq: Every day | ORAL | Status: DC | PRN
  Filled 2013-01-15: qty 1

## 2013-01-15 MED ORDER — OXYCODONE HCL 5 MG PO TABS: 5.0000 mg | ORAL_TABLET | ORAL | Status: DC | PRN

## 2013-01-15 MED ORDER — METOCLOPRAMIDE HCL 5 MG/ML IJ SOLN: INTRAMUSCULAR | Status: DC | PRN

## 2013-01-15 MED ORDER — CEFOXITIN SODIUM 2 G IV SOLR
2.0000 g | INTRAVENOUS | Status: AC
  Administered 2013-01-15: 2 g via INTRAVENOUS
  Filled 2013-01-15: qty 2

## 2013-01-15 MED ORDER — LACTATED RINGERS IV SOLN: INTRAVENOUS | Status: DC

## 2013-01-15 MED ORDER — POTASSIUM CHLORIDE IN NACL 20-0.45 MEQ/L-% IV SOLN
INTRAVENOUS | Status: DC
  Administered 2013-01-15 – 2013-01-16 (×3): via INTRAVENOUS
  Filled 2013-01-15 (×4): qty 1000

## 2013-01-15 MED ORDER — GLYCOPYRROLATE 0.2 MG/ML IJ SOLN
INTRAMUSCULAR | Status: DC | PRN
  Administered 2013-01-15: .4 mg via INTRAVENOUS

## 2013-01-15 MED ORDER — ACETAMINOPHEN 10 MG/ML IV SOLN
INTRAVENOUS | Status: AC
  Filled 2013-01-15: qty 100

## 2013-01-15 MED ORDER — SCOPOLAMINE 1 MG/3DAYS TD PT72
MEDICATED_PATCH | TRANSDERMAL | Status: AC
  Filled 2013-01-15: qty 1

## 2013-01-15 MED ORDER — LIDOCAINE HCL 1 % IJ SOLN
INTRAMUSCULAR | Status: DC | PRN
  Administered 2013-01-15: 20 mL

## 2013-01-15 MED ORDER — CEFOXITIN SODIUM-DEXTROSE 1-4 GM-% IV SOLR (PREMIX)
INTRAVENOUS | Status: AC
  Filled 2013-01-15: qty 100

## 2013-01-15 MED ORDER — SODIUM CHLORIDE 0.9 % IR SOLN
Status: DC | PRN
  Administered 2013-01-15: 1000 mL

## 2013-01-15 MED ORDER — HEPARIN SODIUM (PORCINE) 5000 UNIT/ML IJ SOLN
5000.0000 [IU] | Freq: Three times a day (TID) | INTRAMUSCULAR | Status: DC
  Administered 2013-01-16 – 2013-01-17 (×4): 5000 [IU] via SUBCUTANEOUS
  Filled 2013-01-15 (×8): qty 1

## 2013-01-15 MED ORDER — LIDOCAINE HCL 1 % IJ SOLN
INTRAMUSCULAR | Status: AC
  Filled 2013-01-15: qty 40

## 2013-01-15 MED ORDER — SCOPOLAMINE 1 MG/3DAYS TD PT72
MEDICATED_PATCH | TRANSDERMAL | Status: DC | PRN
  Administered 2013-01-15: 1 via TRANSDERMAL

## 2013-01-15 MED ORDER — HYDROMORPHONE HCL PF 1 MG/ML IJ SOLN
INTRAMUSCULAR | Status: AC
  Filled 2013-01-15: qty 1

## 2013-01-15 MED ORDER — METOCLOPRAMIDE HCL 5 MG/ML IJ SOLN
INTRAMUSCULAR | Status: DC | PRN
  Administered 2013-01-15: 5 mg via INTRAVENOUS

## 2013-01-15 MED ORDER — MORPHINE SULFATE 4 MG/ML IJ SOLN
4.0000 mg | INTRAMUSCULAR | Status: DC | PRN
  Administered 2013-01-15: 4 mg via INTRAVENOUS
  Filled 2013-01-15: qty 1

## 2013-01-15 MED ORDER — LACTATED RINGERS IV SOLN
INTRAVENOUS | Status: DC | PRN
  Administered 2013-01-15: 08:00:00 via INTRAVENOUS
  Administered 2013-01-15: 1000 mL
  Administered 2013-01-15: 07:00:00 via INTRAVENOUS

## 2013-01-15 MED ORDER — LACTATED RINGERS IR SOLN
Status: DC | PRN
  Administered 2013-01-15: 1000 mL

## 2013-01-15 MED ORDER — ACETAMINOPHEN 325 MG PO TABS: 650.0000 mg | ORAL_TABLET | ORAL | Status: DC | PRN

## 2013-01-15 MED ORDER — IOHEXOL 300 MG/ML  SOLN
INTRAMUSCULAR | Status: AC
  Filled 2013-01-15: qty 1

## 2013-01-15 MED ORDER — ACETAMINOPHEN 650 MG RE SUPP
650.0000 mg | RECTAL | Status: DC | PRN
  Filled 2013-01-15: qty 1

## 2013-01-15 MED ORDER — DEXAMETHASONE SODIUM PHOSPHATE 10 MG/ML IJ SOLN
INTRAMUSCULAR | Status: DC | PRN
  Administered 2013-01-15: 10 mg via INTRAVENOUS

## 2013-01-15 MED ORDER — MORPHINE SULFATE 2 MG/ML IJ SOLN
1.0000 mg | INTRAMUSCULAR | Status: DC | PRN
  Administered 2013-01-15 – 2013-01-16 (×3): 2 mg via INTRAVENOUS
  Filled 2013-01-15 (×3): qty 1

## 2013-01-15 MED ORDER — LORATADINE 10 MG PO TABS
10.0000 mg | ORAL_TABLET | Freq: Every day | ORAL | Status: DC | PRN
  Filled 2013-01-15: qty 1

## 2013-01-15 MED ORDER — EPHEDRINE SULFATE 50 MG/ML IJ SOLN
INTRAMUSCULAR | Status: DC | PRN
  Administered 2013-01-15 (×2): 5 mg via INTRAVENOUS

## 2013-01-15 MED ORDER — IOHEXOL 300 MG/ML  SOLN
INTRAMUSCULAR | Status: DC | PRN
  Administered 2013-01-15: 50 mL

## 2013-01-15 MED ORDER — BUPIVACAINE-EPINEPHRINE 0.25% -1:200000 IJ SOLN
INTRAMUSCULAR | Status: DC | PRN
  Administered 2013-01-15: 30 mL

## 2013-01-15 MED ORDER — SODIUM CHLORIDE 0.9 % IJ SOLN: 3.0000 mL | Freq: Two times a day (BID) | INTRAMUSCULAR | Status: DC

## 2013-01-15 MED ORDER — ONDANSETRON HCL 4 MG/2ML IJ SOLN: 4.0000 mg | Freq: Four times a day (QID) | INTRAMUSCULAR | Status: DC | PRN

## 2013-01-15 MED ORDER — SODIUM CHLORIDE 0.9 % IJ SOLN: 3.0000 mL | INTRAMUSCULAR | Status: DC | PRN

## 2013-01-15 MED ORDER — HYDROMORPHONE HCL PF 1 MG/ML IJ SOLN
0.5000 mg | INTRAMUSCULAR | Status: DC | PRN
  Administered 2013-01-15: 0.5 mg via INTRAVENOUS
  Filled 2013-01-15: qty 1

## 2013-01-15 MED ORDER — LIDOCAINE HCL (CARDIAC) 20 MG/ML IV SOLN
INTRAVENOUS | Status: DC | PRN
  Administered 2013-01-15: 50 mg via INTRAVENOUS

## 2013-01-15 MED ORDER — SUCCINYLCHOLINE CHLORIDE 20 MG/ML IJ SOLN
INTRAMUSCULAR | Status: DC | PRN
  Administered 2013-01-15: 100 mg via INTRAVENOUS

## 2013-01-15 MED ORDER — BUPIVACAINE-EPINEPHRINE PF 0.25-1:200000 % IJ SOLN
INTRAMUSCULAR | Status: AC
  Filled 2013-01-15: qty 30

## 2013-01-15 MED ORDER — OXYCODONE-ACETAMINOPHEN 5-325 MG PO TABS
1.0000 | ORAL_TABLET | ORAL | Status: DC | PRN
  Administered 2013-01-16 (×2): 1 via ORAL
  Filled 2013-01-15 (×2): qty 1

## 2013-01-15 MED ORDER — MIDAZOLAM HCL 5 MG/5ML IJ SOLN
INTRAMUSCULAR | Status: DC | PRN
  Administered 2013-01-15 (×2): 1 mg via INTRAVENOUS

## 2013-01-15 MED ORDER — PROPOFOL 10 MG/ML IV EMUL
INTRAVENOUS | Status: DC | PRN
  Administered 2013-01-15: 180 mg via INTRAVENOUS

## 2013-01-15 MED ORDER — BUPIVACAINE-EPINEPHRINE 0.25% -1:200000 IJ SOLN
INTRAMUSCULAR | Status: AC
  Filled 2013-01-15: qty 1

## 2013-01-15 MED ORDER — ONDANSETRON HCL 4 MG/2ML IJ SOLN
4.0000 mg | Freq: Four times a day (QID) | INTRAMUSCULAR | Status: DC | PRN
  Administered 2013-01-15: 4 mg via INTRAVENOUS
  Filled 2013-01-15: qty 2

## 2013-01-15 MED ORDER — OXYCODONE-ACETAMINOPHEN 5-325 MG PO TABS: 1.0000 | ORAL_TABLET | ORAL | Status: DC | PRN

## 2013-01-15 MED ORDER — ACETAMINOPHEN 10 MG/ML IV SOLN
INTRAVENOUS | Status: DC | PRN
  Administered 2013-01-15: 1000 mg via INTRAVENOUS

## 2013-01-15 MED ORDER — PROMETHAZINE HCL 25 MG/ML IJ SOLN: 6.2500 mg | INTRAMUSCULAR | Status: DC | PRN

## 2013-01-15 MED ORDER — CISATRACURIUM BESYLATE (PF) 10 MG/5ML IV SOLN
INTRAVENOUS | Status: DC | PRN
  Administered 2013-01-15: 6 mg via INTRAVENOUS

## 2013-01-15 MED ORDER — ONDANSETRON HCL 4 MG/2ML IJ SOLN
INTRAMUSCULAR | Status: DC | PRN
  Administered 2013-01-15 (×2): 2 mg via INTRAVENOUS

## 2013-01-15 MED ORDER — FENTANYL CITRATE 0.05 MG/ML IJ SOLN
INTRAMUSCULAR | Status: DC | PRN
  Administered 2013-01-15: 25 ug via INTRAVENOUS
  Administered 2013-01-15: 150 ug via INTRAVENOUS
  Administered 2013-01-15 (×3): 25 ug via INTRAVENOUS

## 2013-01-15 MED ORDER — SODIUM CHLORIDE 0.9 % IV SOLN: 250.0000 mL | INTRAVENOUS | Status: DC | PRN

## 2013-01-15 SURGICAL SUPPLY — 40 items
APPLIER CLIP ROT 10 11.4 M/L (STAPLE) ×2
CANISTER SUCTION 2500CC (MISCELLANEOUS) ×2 IMPLANT
CHLORAPREP W/TINT 26ML (MISCELLANEOUS) ×2 IMPLANT
CLIP APPLIE ROT 10 11.4 M/L (STAPLE) ×1 IMPLANT
CLOTH BEACON ORANGE TIMEOUT ST (SAFETY) ×2 IMPLANT
CONT SPECI 4OZ STER CLIK (MISCELLANEOUS) IMPLANT
COVER MAYO STAND STRL (DRAPES) ×2 IMPLANT
COVER SURGICAL LIGHT HANDLE (MISCELLANEOUS) IMPLANT
DECANTER SPIKE VIAL GLASS SM (MISCELLANEOUS) ×2 IMPLANT
DERMABOND ADVANCED (GAUZE/BANDAGES/DRESSINGS) ×1
DERMABOND ADVANCED .7 DNX12 (GAUZE/BANDAGES/DRESSINGS) ×1 IMPLANT
DRAPE C-ARM 42X72 X-RAY (DRAPES) ×2 IMPLANT
DRAPE LAPAROSCOPIC ABDOMINAL (DRAPES) ×2 IMPLANT
ELECT REM PT RETURN 9FT ADLT (ELECTROSURGICAL) ×2
ELECTRODE REM PT RTRN 9FT ADLT (ELECTROSURGICAL) ×1 IMPLANT
GLOVE BIO SURGEON STRL SZ 6 (GLOVE) ×4 IMPLANT
GLOVE BIOGEL PI IND STRL 6.5 (GLOVE) ×1 IMPLANT
GLOVE BIOGEL PI IND STRL 7.0 (GLOVE) ×1 IMPLANT
GLOVE BIOGEL PI INDICATOR 6.5 (GLOVE) ×1
GLOVE BIOGEL PI INDICATOR 7.0 (GLOVE) ×1
GLOVE INDICATOR 6.5 STRL GRN (GLOVE) ×4 IMPLANT
GOWN PREVENTION PLUS XLARGE (GOWN DISPOSABLE) ×6 IMPLANT
GOWN PREVENTION PLUS XXLARGE (GOWN DISPOSABLE) ×2 IMPLANT
GOWN STRL NON-REIN LRG LVL3 (GOWN DISPOSABLE) ×2 IMPLANT
HEMOSTAT SURGICEL 4X8 (HEMOSTASIS) IMPLANT
KIT BASIN OR (CUSTOM PROCEDURE TRAY) ×2 IMPLANT
POUCH SPECIMEN RETRIEVAL 10MM (ENDOMECHANICALS) ×2 IMPLANT
SCISSORS LAP 5X35 DISP (ENDOMECHANICALS) ×2 IMPLANT
SET CHOLANGIOGRAPH MIX (MISCELLANEOUS) ×2 IMPLANT
SET IRRIG TUBING LAPAROSCOPIC (IRRIGATION / IRRIGATOR) ×2 IMPLANT
SOLUTION ANTI FOG 6CC (MISCELLANEOUS) ×2 IMPLANT
SUT MNCRL AB 4-0 PS2 18 (SUTURE) ×2 IMPLANT
SUT VICRYL 0 UR6 27IN ABS (SUTURE) ×2 IMPLANT
TOWEL OR 17X26 10 PK STRL BLUE (TOWEL DISPOSABLE) ×2 IMPLANT
TOWEL OR NON WOVEN STRL DISP B (DISPOSABLE) ×2 IMPLANT
TRAY LAP CHOLE (CUSTOM PROCEDURE TRAY) ×2 IMPLANT
TROCAR BLADELESS OPT 5 75 (ENDOMECHANICALS) ×4 IMPLANT
TROCAR XCEL BLUNT TIP 100MML (ENDOMECHANICALS) ×2 IMPLANT
TROCAR XCEL NON-BLD 11X100MML (ENDOMECHANICALS) ×2 IMPLANT
TUBING INSUFFLATION 10FT LAP (TUBING) ×2 IMPLANT

## 2013-01-15 NOTE — Anesthesia Postprocedure Evaluation (Signed)
Anesthesia Post Note  Patient: Elizabeth Dominguez  Procedure(s) Performed: Procedure(s) (LRB): LAPAROSCOPIC CHOLECYSTECTOMY WITH INTRAOPERATIVE CHOLANGIOGRAM (N/A)  Anesthesia type: General  Patient location: PACU  Post pain: Pain level controlled  Post assessment: Post-op Vital signs reviewed  Last Vitals:  Filed Vitals:   01/15/13 0945  BP: 125/58  Pulse: 67  Temp:   Resp: 11    Post vital signs: Reviewed  Level of consciousness: sedated  Complications: No apparent anesthesia complications

## 2013-01-15 NOTE — H&P (Signed)
Re:   Elizabeth Dominguez DOB:   Dec 01, 1960 MRN:   [id number]  ASSESSMENT AND PLAN: 1.  S/P lap chole with IOC, earlier today, 01/15/2013  Dr. Marilynne Halsted  Significant nausea and vomiting earlier today, but somewhat better now.  Plan clear liquids overnight and control nausea.  2.  Small right apical pnuemothorax (<5%)  Probably secondary to wretching.  Plan overnight observation.  Repeat CXR in AM.  Then see how she does.  Chief Complaint  Patient presents with  . Post-op Problem   REFERRING PHYSICIAN:  Dr. Leander Rams, [facility]  HISTORY OF PRESENT ILLNESS: Elizabeth Dominguez is a 52 y.o. (DOB: 05-Mar-1961)  white  female whose primary care physician is No PCP Per Patient and comes to the [facility] today with chest pain after having a lap chole earlier today by Dr. Donell Beers.  The patient originally saw Dr. Luisa Hart for symptomatic gall stones.  But because of "fear of eating and continued attacks", Dr. Donell Beers assumed care of the patient.  Dr. Donell Beers did a lap chole with IOC today.  She was not actually doing well when she left here today.  The patient got home, went straight to the bathroom and continued to vomit.  She had severe wretching and then developed sudden right sided chest pain.  She came to the [facility] because of the nausea and chest pain.  A CXR showed a 5% right pneumothorax and Dr. Oletta Lamas called me.  She has no prior history of chest disease and she does not smoke cigarettes.    Past Medical History  Diagnosis Date  . Cancer     skin  . Complication of anesthesia     PT STATES SHE GETS ANXIOUS BEFORE SURGERIES AND HAS NAUSEA BEFORE SURGERY.  Marland Kitchen Anxiety     PANIC ATTACKS  . Gallstones     ABD PAIN, AND NAUSEA  . Low blood sugar     USUALLY  . Fainting episodes     YRS AGO --NO CAUSE FOUND- POSS SEIZURES--TOOK TOPAMAX FOR A WHILE--BUT OFF THE MEDICINE NOW FOR 7 YEARS AND NO PROBLEM SINCE      Past Surgical History  Procedure Laterality Date  . Nasal septum surgery  1987, 1998  .  Abdominal hysterectomy  2008  . Shoulder arthroscopy      left  . Incontinence surgery  2010    BLADDER SLING  . Umbilical hernia repair  10/18/11  . Hernia repair  10/2011    umb hernia repair  . Cholecystectomy  01/15/13      Current Facility-Administered Medications  Medication Dose Route Frequency Provider Last Rate Last Dose  . morphine 4 MG/ML injection 4 mg  4 mg Intravenous Q3H PRN Gavin Pound. Ghim, MD       Current Outpatient Prescriptions  Medication Sig Dispense Refill  . acetaminophen (TYLENOL) 500 MG tablet Take 1,000 mg by mouth every 6 (six) hours as needed for pain.      Marland Kitchen diclofenac sodium (VOLTAREN) 1 % GEL Apply 2 g topically 4 (four) times daily as needed (for joint pain.--FOR TENDONITIS RT ELBOW).       Marland Kitchen docusate sodium (COLACE) 100 MG capsule Take 200 mg by mouth daily as needed for constipation.       . Famotidine-Ca Carb-Mag Hydrox (PEPCID COMPLETE PO) Take 1 tablet by mouth daily as needed (for heart burn). ONCE A DAY IF NEEDED      . fexofenadine (ALLEGRA) 180 MG tablet Take 180 mg by mouth  daily as needed (for allergies).      Marland Kitchen HYDROcodone-acetaminophen (NORCO/VICODIN) 5-325 MG per tablet Take 2 tablets by mouth once.      . Multiple Vitamin (MULTIVITAMIN WITH MINERALS) TABS Take 1 tablet by mouth every morning.       Marland Kitchen oxyCODONE-acetaminophen (ROXICET) 5-325 MG per tablet Take 1-2 tablets by mouth every 4 (four) hours as needed for pain.  30 tablet  0      Allergies  Allergen Reactions  . Dilaudid (Hydromorphone Hcl) Itching and Rash    REVIEW OF SYSTEMS: Skin:  No history of rash.  No history of abnormal moles. Infection:   No history of MRSA. Neurologic:  No history of stroke.  No history of seizure.  No history of headaches. Cardiac:  No history of hypertension. No history of heart disease.   No history of seeing a cardiologist. Pulmonary:  Does not smoke cigarettes.  No asthma or bronchitis.  No OSA/CPAP.  See HPI.  Endocrine:  No diabetes. No  thyroid disease. Gastrointestinal:  No history of stomach disease.  No history of liver disease.  No history of pancreas disease.  No history of colon disease.  Umbilical hernia by Dr. Luisa Hart in 2013. Urologic:  Bladder sling by Dr. Kathie Rhodes. MacDiarmid in 2010. It has done fair. GYN:  Hysterectomy 2008 - Dr Vickey Sages. Musculoskeletal:  Left shoulder bone spurs, surgery by Dr. Pilar Grammes - 2008. Hematologic:  No bleeding disorder.  No history of anemia.  Not anticoagulated. Psycho-social:  The patient is oriented.   The patient has no obvious psychologic or social impairment to understanding our conversation and plan.  SOCIAL and FAMILY HISTORY: Married. Her husband went to find something to eat. 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 She has one teenage daughter -age 1.  They were supposed to go to the beach next week.  PHYSICAL EXAM: BP 133/66  Pulse 68  Temp(Src) 98.6 F (37 C) (Oral)  Resp 21  SpO2 100%  General: WN WF who is alert. HEENT: Normal. Pupils equal. Neck: Supple. No mass.  No thyroid mass. Lymph Nodes:  No supraclavicular or cervical nodes. Lungs: Clear to auscultation and symmetric breath sounds.  She points to pain medial and inferior to her right scapula. Heart:  RRR. No murmur or rub. Abdomen: Soft. No mass. No tenderness. No hernia. Normal bowel sounds.  Incisions with dermabond. Rectal: Not done. Extremities:  Good strength and ROM  in upper and lower extremities. Neurologic:  Grossly intact to motor and sensory function. Psychiatric: Behavior is normal.   DATA REVIEWED: WBC - 9,100 - 01/15/2013 at 17:30 CMP - elevated Glucose to 179 - 01/15/2013 at 17:30, normal LFT's  Ovidio Kin, MD,  [facility], [place] [address]  [address]   [place], [place]    [address] Phone:  [phone] FAX:  [phone]

## 2013-01-15 NOTE — Op Note (Signed)
Laparoscopic Cholecystectomy with IOC Procedure Note  Indications: This patient presents with symptomatic cholelithiasis and will undergo laparoscopic cholecystectomy.  Pre-operative Diagnosis: symptomatic cholelithiasis  Post-operative Diagnosis: Same + chronic cholecystitis  Surgeon: ZOXWRU,EAVWU   Assistants: none  Anesthesia: General endotracheal anesthesia and local  ASA Class: 1  Procedure Details  The patient was seen again in the Holding Room. The risks, benefits, complications, treatment options, and expected outcomes were discussed with the patient. The possibilities of  bleeding, recurrent infection, damage to nearby structures, the need for additional procedures, failure to diagnose a condition, the possible need to convert to an open procedure, and creating a complication requiring transfusion or operation were discussed with the patient. The likelihood of improving the patient's symptoms with return to their baseline status is good.    The patient and/or family concurred with the proposed plan, giving informed consent. The site of surgery properly noted. The patient was taken to Operating Room, and the procedure verified as Laparoscopic Cholecystectomy with Intraoperative Cholangiogram. A Time Out was held and the above information confirmed.  Prior to the induction of general anesthesia, antibiotic prophylaxis was administered. General endotracheal anesthesia was then administered and tolerated well. After the induction, the abdomen was prepped with Chloraprep and draped in the sterile fashion. The patient was positioned in the supine position.  Local anesthetic agent was injected into the skin near the umbilicus and an incision made. We dissected down to the abdominal fascia with blunt dissection.  The fascia was incised vertically and we entered the peritoneal cavity bluntly.  A pursestring suture of 0-Vicryl was placed around the fascial opening.  The Hasson cannula was  inserted and secured with the stay suture.  Pneumoperitoneum was then created with CO2 and tolerated well without any adverse changes in the patient's vital signs. An 11-mm port was placed in the subxiphoid position.  Two 5-mm ports were placed in the right upper quadrant. All skin incisions were infiltrated with a local anesthetic agent before making the incision and placing the trocars.   We positioned the patient in reverse Trendelenburg, tilted slightly to the patient's left.  The gallbladder was identified, the fundus grasped and retracted cephalad. Adhesions were lysed bluntly and with the electrocautery where indicated, taking care not to injure any adjacent organs or viscus. The infundibulum was grasped and retracted laterally, exposing the peritoneum overlying the triangle of Calot. This was then divided and exposed in a blunt fashion. A critical view of the cystic duct and cystic artery was obtained.  The cystic duct was clearly identified and bluntly dissected circumferentially. The cystic duct was ligated with a clip distally.   An incision was made in the cystic duct and the [facility] cholangiogram catheter introduced. The catheter was secured using a clip. A cholangiogram was then performed, demonstrating good filling of the common duct, no filling defects, demonstration of right and left hepatic ducts, and prompt entry into duodenum.  The cystic duct was then ligated with clips and divided. The cystic artery was identified, dissected free, ligated with clips and divided as well.   The gallbladder was dissected from the liver bed in retrograde fashion with the electrocautery. The gallbladder was removed and placed in an Endocatch bag.  The gallbladder and Endocatch bag were then removed through the umbilical port site.  The liver bed was irrigated and inspected. Hemostasis was achieved with the electrocautery. Copious irrigation was utilized and was repeatedly aspirated until clear.    We again  inspected the right upper quadrant  for hemostasis.  Pneumoperitoneum was released as we removed the trocars.   The pursestring suture was used to close the umbilical fascia.  4-0 Monocryl was used to close the skin.   The skin was cleaned and dry, and Dermabond was applied. The patient was then extubated and brought to the recovery room in stable condition. Instrument, sponge, and needle counts were correct at closure and at the conclusion of the case.   Findings: Numerous omental adhesions.  Sl thickened gallbladder wall.  Gallbladder full of stones, none spilled.    Estimated Blood Loss: min         Drains: none          Specimens: Gallbladder to pathology       Complications: None; patient tolerated the procedure well.         Disposition: PACU - hemodynamically stable.         Condition: stable

## 2013-01-15 NOTE — ED Notes (Signed)
Pt presenting to ed with c/o gallbladder surgery today and now she is having right sided chest pain that radiates into her back pt states positive nausea and vomiting.

## 2013-01-15 NOTE — Discharge Instructions (Signed)
[facility]      [facility], [place] [phone]  ABDOMINAL SURGERY: POST OP INSTRUCTIONS  Always review your discharge instruction sheet given to you by the facility where your surgery was performed.  IF YOU HAVE DISABILITY OR FAMILY LEAVE FORMS, YOU MUST BRING THEM TO THE OFFICE FOR PROCESSING.  PLEASE DO NOT GIVE THEM TO YOUR DOCTOR.  1. A prescription for pain medication may be given to you upon discharge.  Take your pain medication as prescribed, if needed.  If narcotic pain medicine is not needed, then you may take acetaminophen (Tylenol) or ibuprofen (Advil) as needed. 2. Take your usually prescribed medications unless otherwise directed. 3. If you need a refill on your pain medication, please contact your pharmacy. They will contact our office to request authorization.  Prescriptions will not be filled after 5pm or on week-ends. 4. You should follow a light diet the first few days after arrival home, such as soup and crackers, pudding, etc.unless your doctor has advised otherwise. A high-fiber, low fat diet can be resumed as tolerated.   Be sure to include lots of fluids daily. Most patients will experience some swelling and bruising on the chest and neck area.  Ice packs will help.  Swelling and bruising can take several days to resolve 5. Most patients will experience some swelling and bruising in the area of the incision. Ice pack will help. Swelling and bruising can take several days to resolve..  6. It is common to experience some constipation if taking pain medication after surgery.  Increasing fluid intake and taking a stool softener will usually help or prevent this problem from occurring.  A mild laxative (Milk of Magnesia or Miralax) should be taken according to package directions if there are no bowel movements after 48 hours. 7.  You may have steri-strips (small skin tapes) in place directly over the incision.  These strips should be left on the skin for 10-14 days.  If your  surgeon used skin glue on the incision, you may shower in 48 hours.  The glue will flake off over the next 2-3 weeks.  Any sutures or staples will be removed at the office during your follow-up visit. You may find that a light gauze bandage over your incision may keep your staples from being rubbed or pulled. You may shower and replace the bandage daily. 8. ACTIVITIES:  You may resume regular (light) daily activities beginning the next day--such as daily self-care, walking, climbing stairs--gradually increasing activities as tolerated.  You may have sexual intercourse when it is comfortable.  Refrain from any heavy lifting or straining until approved by your doctor.  No heavy lifting for 2-4 weeks. a. You may drive when you no longer are taking prescription pain medication, you can comfortably wear a seatbelt, and you can safely maneuver your car and apply brakes b. Return to Work: __________1-4 weeks if applicable_________________________ 9. You should see your doctor in the office for a follow-up appointment approximately two weeks after your surgery.  Make sure that you call for this appointment within a day or two after you arrive home to insure a convenient appointment time. OTHER INSTRUCTIONS:  _____________________________________________________________ _____________________________________________________________  WHEN TO CALL YOUR DOCTOR: 1. Fever over 101.0 2. Inability to urinate 3. Nausea and/or vomiting 4. Extreme swelling or bruising 5. Continued bleeding from incision. 6. Increased pain, redness, or drainage from the incision. 7. Difficulty swallowing or breathing 8. Muscle cramping or spasms. 9. Numbness or tingling in hands or feet or  around lips.  The clinic staff is available to answer your questions during regular business hours.  Please dont hesitate to call and ask to speak to one of the nurses if you have concerns.  For further questions, please visit  [email or web address]

## 2013-01-15 NOTE — ED Provider Notes (Signed)
History     CSN: [id number]  Arrival date & time 01/15/13  1715   First MD Initiated Contact with Patient 01/15/13 1722      Chief Complaint  Patient presents with  . Post-op Problem    (Consider location/radiation/quality/duration/timing/severity/associated sxs/prior treatment) HPI Comments: Pt had lap cholecystectomy earlier this AM as well as repeat repair of umbilical hernia by Dr. Donell Beers.  Pt vomited once afterwards, then had a very forceful episode of heaving and emesis at home and shortly afterwards developed a sharp persistent pain in right flank radiating to right upper back area.  She reports there is pain with deep breath, no cough, no fever or chills.  She has had kidney stone in the past, but pain then was much more low on flank.  No dysuria or hematuria.  She reports no other sig PMH.    The history is provided by the patient, medical records and the spouse.    Past Medical History  Diagnosis Date  . Cancer     skin  . Complication of anesthesia     PT STATES SHE GETS ANXIOUS BEFORE SURGERIES AND HAS NAUSEA BEFORE SURGERY.  Marland Kitchen Anxiety     PANIC ATTACKS  . Gallstones     ABD PAIN, AND NAUSEA  . Low blood sugar     USUALLY  . Fainting episodes     YRS AGO --NO CAUSE FOUND- POSS SEIZURES--TOOK TOPAMAX FOR A WHILE--BUT OFF THE MEDICINE NOW FOR 7 YEARS AND NO PROBLEM SINCE    Past Surgical History  Procedure Laterality Date  . Nasal septum surgery  1987, 1998  . Abdominal hysterectomy  2008  . Shoulder arthroscopy      left  . Incontinence surgery  2010    BLADDER SLING  . Umbilical hernia repair  10/18/11  . Hernia repair  10/2011    umb hernia repair    Family History  Problem Relation Age of Onset  . Hypertension Mother   . Cancer Mother 26    breast  . Cancer Father     skin  . Parkinson's disease Father   . Heart disease Maternal Grandfather   . Diabetes Sister     History  Substance Use Topics  . Smoking status: Never Smoker   . Smokeless  tobacco: Never Used  . Alcohol Use: Yes     Comment: occ    OB History   Grav Para Term Preterm Abortions TAB SAB Ect Mult Living                  Review of Systems  Constitutional: Negative for fever and chills.  Respiratory: Negative for cough.   Cardiovascular: Negative for chest pain.  Gastrointestinal: Positive for nausea, vomiting and abdominal pain. Negative for diarrhea.  Genitourinary: Negative for dysuria and hematuria.  Musculoskeletal: Positive for back pain.  Skin: Negative for rash and wound.  All other systems reviewed and are negative.    Allergies  Dilaudid  Home Medications   Current Outpatient Rx  Name  Route  Sig  Dispense  Refill  . acetaminophen (TYLENOL) 500 MG tablet   Oral   Take 1,000 mg by mouth every 6 (six) hours as needed for pain.         Marland Kitchen diclofenac sodium (VOLTAREN) 1 % GEL   Topical   Apply 2 g topically 4 (four) times daily as needed (for joint pain.--FOR TENDONITIS RT ELBOW).          Marland Kitchen docusate  sodium (COLACE) 100 MG capsule   Oral   Take 200 mg by mouth daily as needed for constipation.          . Famotidine-Ca Carb-Mag Hydrox (PEPCID COMPLETE PO)   Oral   Take 1 tablet by mouth daily as needed (for heart burn). ONCE A DAY IF NEEDED         . fexofenadine (ALLEGRA) 180 MG tablet   Oral   Take 180 mg by mouth daily as needed (for allergies).         Marland Kitchen HYDROcodone-acetaminophen (NORCO/VICODIN) 5-325 MG per tablet   Oral   Take 2 tablets by mouth once.         . Multiple Vitamin (MULTIVITAMIN WITH MINERALS) TABS   Oral   Take 1 tablet by mouth every morning.          Marland Kitchen oxyCODONE-acetaminophen (ROXICET) 5-325 MG per tablet   Oral   Take 1-2 tablets by mouth every 4 (four) hours as needed for pain.   30 tablet   0     BP 133/66  Pulse 68  Temp(Src) 98.6 F (37 C) (Oral)  Resp 21  SpO2 100%  Physical Exam  Nursing note and vitals reviewed. Constitutional: She appears well-developed and  well-nourished. No distress.  HENT:  Head: Normocephalic and atraumatic.  Eyes: Conjunctivae and EOM are normal. No scleral icterus.  Neck: Normal range of motion. Neck supple.  Cardiovascular: Normal rate and intact distal pulses.   Pulmonary/Chest: Effort normal. No respiratory distress. She has no wheezes. She has no rales.  Abdominal: Soft. There is tenderness. There is guarding.  Musculoskeletal: She exhibits no edema.  Neurological: She is alert. She exhibits normal muscle tone. Coordination normal.  Skin: Skin is warm. No rash noted. She is not diaphoretic.  Psychiatric: She has a normal mood and affect.    ED Course  Procedures (including critical care time)  Labs Reviewed  CBC WITH DIFFERENTIAL - Abnormal; Notable for the following:    Neutrophils Relative 95 (*)    Neutro Abs 8.6 (*)    Lymphocytes Relative 4 (*)    Lymphs Abs 0.4 (*)    Monocytes Relative 1 (*)    All other components within normal limits  COMPREHENSIVE METABOLIC PANEL - Abnormal; Notable for the following:    Glucose, Bld 179 (*)    AST 51 (*)    All other components within normal limits  URINALYSIS, ROUTINE W REFLEX MICROSCOPIC - Abnormal; Notable for the following:    Hgb urine dipstick TRACE (*)    Ketones, ur 15 (*)    All other components within normal limits  LIPASE, BLOOD  URINE MICROSCOPIC-ADD ON   Dg Cholangiogram Operative  01/15/2013  *RADIOLOGY REPORT*  Clinical Data:   Cholecystectomy for cholelithiasis.  INTRAOPERATIVE CHOLANGIOGRAM  Technique:  Cholangiographic images from the C-arm fluoroscopic device were submitted for interpretation post-operatively.  Please see the procedural report for the amount of contrast and the fluoroscopy time utilized.  Comparison:  Abdominal ultrasound dated 12/25/2012  Findings:  Intraoperative imaging obtained with a C-arm shows a relatively low cystic duct insertion.  There is subtle suggestion of a filling defect in the common duct just below the cystic  duct insertion which changes over time and is felt to most likely represents one or more air bubbles.  Contrast does flow freely into the duodenum.  No extravasation of contrast identified.  Numerous partially calcified gallstones are visualized in the gallbladder.  IMPRESSION: Low cystic duct  insertion and suggestion of filling defect in the common duct which changes over time and is most likely representative of one or more air bubbles.   Original Report Authenticated By: Irish Lack, M.D.    Dg Chest Port 1 View  01/15/2013  *RADIOLOGY REPORT*  Clinical Data: Chest pain.  PORTABLE CHEST - 1 VIEW  Comparison: 12/25/2012  Findings: There is a tiny right apical pneumothorax with slight atelectasis at the right lung base.  Heart size and vascularity are normal.  Left lung is clear.  IMPRESSION: Tiny right apical pneumothorax.   Original Report Authenticated By: Francene Boyers, M.D.      1. Pneumothorax on right     7:05 PM Spoke to Dr. Ezzard Standing who will see pt, likely admit overnight for observation.  I suspect iatrogenic PTX after forceful vomiting.  Pt's sats remain 100%, had transient redness to forearm after IV dilaudid and also didn't help pain much, will switch to morphine which pt has had before.     MDM  Pt with sig post operative pain with presentation of biliary colic.  Will repeat labs, CXR and give IV analgesics and zofran.          Gavin Pound. Oletta Lamas, MD 01/15/13 2243

## 2013-01-15 NOTE — Progress Notes (Signed)
Patient had CMET done on 12/25/12. Results are still in 30 day window. Order for CMET canceled per protocol.

## 2013-01-15 NOTE — Preoperative (Signed)
Beta Blockers   Reason not to administer Beta Blockers:Not Applicable 

## 2013-01-15 NOTE — ED Notes (Signed)
Pt had choleycystectomy this am and has since had pain to RT chest all the way through to her shoulder blade.  Pt took her pain meds w/o relief.  They called Dr. Donell Beers who is having her come in for eval.

## 2013-01-15 NOTE — Telephone Encounter (Signed)
Patient's husband called stating the patient was discharged today after lap chole. When she got home approximately two hours ago she had one episode of vomiting. Since that time she has had a sharp pain in the right side of her chest and she is having difficulty taking a deep breath. She has taken her pain medication and it is not helping with the pain. I paged Dr Donell Beers who advised at this time for her to go back to [facility] ER for evaluation. Patient's husband made aware and they will go to [facility] ER.

## 2013-01-15 NOTE — Interval H&P Note (Signed)
History and Physical Interval Note:  01/15/2013 7:26 AM  Elizabeth Dominguez  has presented today for surgery, with the diagnosis of gallstones  The various methods of treatment have been discussed with the patient and family. After consideration of risks, benefits and other options for treatment, the patient has consented to  Procedure(s): LAPAROSCOPIC CHOLECYSTECTOMY WITH INTRAOPERATIVE CHOLANGIOGRAM (N/A) as a surgical intervention .  The patient's history has been reviewed, patient examined, no change in status, stable for surgery.  I have reviewed the patient's chart and labs.  Questions were answered to the patient's satisfaction.     [name]

## 2013-01-15 NOTE — Transfer of Care (Signed)
Immediate Anesthesia Transfer of Care Note  Patient: Elizabeth Dominguez  Procedure(s) Performed: Procedure(s): LAPAROSCOPIC CHOLECYSTECTOMY WITH INTRAOPERATIVE CHOLANGIOGRAM (N/A)  Patient Location: PACU  Anesthesia Type:General  Level of Consciousness: awake, oriented, patient cooperative, lethargic and responds to stimulation  Airway & Oxygen Therapy: Patient Spontanous Breathing and Patient connected to face mask oxygen  Post-op Assessment: Report given to PACU RN, Post -op Vital signs reviewed and stable and Patient moving all extremities  Post vital signs: Reviewed and stable  Complications: No apparent anesthesia complications

## 2013-01-15 NOTE — H&P (View-Only) (Signed)
HISTORY: Pt had gallbladder surgery planned this Wednesday, but this was inadvertently scheduled while Dr. Luisa Hart is out of town.  The patient is having significant pain attacks on a daily basis, although she is trying to avoid dairy, greasy food, meats, and anything she thinks may cause her GI distress and/or pain.  She does not want to wait an additional week.  She is concerned that she may be having some symptoms at her hernia site again.     PERTINENT REVIEW OF SYSTEMS: Otherwise negative.     EXAM: Head: Normocephalic and atraumatic.  Eyes:  Conjunctivae are normal. Pupils are equal, round, and reactive to light. No scleral icterus.  Neck:  Normal range of motion. Neck supple. No tracheal deviation present. No thyromegaly present.  Resp: No respiratory distress, normal effort. Abd:  Abdomen is soft, non distended.  Mild tenderness in RUQ. No masses are palpable.  There is no rebound and no guarding.  Neurological: Alert and oriented to person, place, and time. Coordination normal.  Skin: Skin is warm and dry. No rash noted. No diaphoretic. No erythema. No pallor.  Psychiatric: Normal mood and affect. Normal behavior. Judgment and thought content normal.      ASSESSMENT AND PLAN:   Symptomatic cholelithiasis Pt having fear of eating and continued attacks.   She would like gallbladder taken out as soon as possible.  Since Dr. Luisa Hart is out of town, I have agreed to do it.  We will plan to do Thursday. Reviewed surgery, risks and benefits.    Pt had repair of umbilical hernia last year.  Will do Optiview port and look at umbilicus.  May use 5 total ports.      Maudry Diego, MD Surgical Oncology, General & Endocrine Surgery [facility]  No PCP Per Patient No ref. provider found

## 2013-01-15 NOTE — Anesthesia Preprocedure Evaluation (Signed)
Anesthesia Evaluation  Patient identified by MRN, date of birth, ID band Patient awake    Reviewed: Allergy & Precautions, H&P , NPO status , Patient's Chart, lab work & pertinent test results  Airway Mallampati: III TM Distance: <3 FB Neck ROM: Full    Dental  (+) Teeth Intact and Dental Advisory Given   Pulmonary neg pulmonary ROS,  breath sounds clear to auscultation  Pulmonary exam normal       Cardiovascular negative cardio ROS  Rhythm:Regular Rate:Normal     Neuro/Psych Anxiety negative neurological ROS  negative psych ROS   GI/Hepatic negative GI ROS, Neg liver ROS,   Endo/Other  negative endocrine ROS  Renal/GU negative Renal ROS  negative genitourinary   Musculoskeletal negative musculoskeletal ROS (+)   Abdominal   Peds  Hematology negative hematology ROS (+)   Anesthesia Other Findings   Reproductive/Obstetrics negative OB ROS                           Anesthesia Physical Anesthesia Plan  ASA: I  Anesthesia Plan: General   Post-op Pain Management:    Induction: Intravenous  Airway Management Planned: Oral ETT  Additional Equipment:   Intra-op Plan:   Post-operative Plan: Extubation in OR  Informed Consent: I have reviewed the patients History and Physical, chart, labs and discussed the procedure including the risks, benefits and alternatives for the proposed anesthesia with the patient or authorized representative who has indicated his/her understanding and acceptance.   Dental advisory given  Plan Discussed with: CRNA  Anesthesia Plan Comments:         Anesthesia Quick Evaluation

## 2013-01-15 NOTE — ED Notes (Signed)
Dr Ezzard Standing in to see pt.

## 2013-01-16 ENCOUNTER — Observation Stay (HOSPITAL_COMMUNITY): Payer: Managed Care, Other (non HMO)

## 2013-01-16 ENCOUNTER — Encounter (HOSPITAL_COMMUNITY): Payer: Self-pay | Admitting: General Surgery

## 2013-01-16 NOTE — Care Management Note (Signed)
    Page 1 of 1   01/16/2013     11:29:54 AM   CARE MANAGEMENT NOTE 01/16/2013  Patient:  Elizabeth Dominguez, Elizabeth Dominguez   Account Number:  [id number]  Date Initiated:  01/16/2013  Documentation initiated by:  Lorenda Ishihara  Subjective/Objective Assessment:   52 yo female admitted s/p lap chole as OP, developed n/v/chest pain. PTA lived at home with spouse.     Action/Plan:   Home when stable, pain controlled.   Anticipated DC Date:  01/17/2013   Anticipated DC Plan:  HOME/SELF CARE      DC Planning Services  CM consult      Choice offered to / List presented to:             Status of service:  Completed, signed off Medicare Important Message given?   (If response is "NO", the following Medicare IM given date fields will be blank) Date Medicare IM given:   Date Additional Medicare IM given:    Discharge Disposition:  HOME/SELF CARE  Per UR Regulation:  Reviewed for med. necessity/level of care/duration of stay  If discussed at Long Length of Stay Meetings, dates discussed:    Comments:

## 2013-01-16 NOTE — Progress Notes (Signed)
Patient ID: Elizabeth Dominguez, female   DOB: 29-Dec-1960, 53 y.o.   MRN: [id number]    Subjective: Chest pain no better. Sharp pain through right side of chest when taking a deep breath. Not really short of breath. Nausea has resolved and she is tolerating clear liquids. No abnormal pain.  Objective: Vital signs in last 24 hours: Temp:  [97.4 F (36.3 C)-98.9 F (37.2 C)] 98.9 F (37.2 C) (04/18 0538) Pulse Rate:  [61-96] 61 (04/18 0538) Resp:  [11-21] 18 (04/18 0538) BP: (104-133)/(58-73) 110/73 mmHg (04/18 0538) SpO2:  [93 %-100 %] 100 % (04/18 0538) Weight:  [187 lb 9 oz (85.078 kg)] 187 lb 9 oz (85.078 kg) (04/17 2200) Last BM Date: 01/14/13  Intake/Output from previous day: 04/17 0701 - 04/18 0700 In: 1185 [P.O.:360; I.V.:825] Out: 950 [Urine:950] Intake/Output this shift: Total I/O In: -  Out: 450 [Urine:450]  General appearance: alert, cooperative and no distress Resp: clear to auscultation bilaterally Chest wall: no tenderness, right sided chest wall tenderness GI: normal findings: soft, non-tender Incision/Wound: clean and dry  Lab Results:   Recent Labs  01/15/13 1730  WBC 9.1  HGB 12.8  HCT 36.0  PLT 183   BMET  Recent Labs  01/15/13 1730  NA 136  K 3.9  CL 101  CO2 27  GLUCOSE 179*  BUN 11  CREATININE 0.76  CALCIUM 9.2     Studies/Results: Dg Cholangiogram Operative  01/15/2013  *RADIOLOGY REPORT*  Clinical Data:   Cholecystectomy for cholelithiasis.  INTRAOPERATIVE CHOLANGIOGRAM  Technique:  Cholangiographic images from the C-arm fluoroscopic device were submitted for interpretation post-operatively.  Please see the procedural report for the amount of contrast and the fluoroscopy time utilized.  Comparison:  Abdominal ultrasound dated 12/25/2012  Findings:  Intraoperative imaging obtained with a C-arm shows a relatively low cystic duct insertion.  There is subtle suggestion of a filling defect in the common duct just below the cystic duct  insertion which changes over time and is felt to most likely represents one or more air bubbles.  Contrast does flow freely into the duodenum.  No extravasation of contrast identified.  Numerous partially calcified gallstones are visualized in the gallbladder.  IMPRESSION: Low cystic duct insertion and suggestion of filling defect in the common duct which changes over time and is most likely representative of one or more air bubbles.   Original Report Authenticated By: Irish Lack, M.D.    Dg Chest Port 1 View  01/15/2013  *RADIOLOGY REPORT*  Clinical Data: Chest pain.  PORTABLE CHEST - 1 VIEW  Comparison: 12/25/2012  Findings: There is a tiny right apical pneumothorax with slight atelectasis at the right lung base.  Heart size and vascularity are normal.  Left lung is clear.  IMPRESSION: Tiny right apical pneumothorax.   Original Report Authenticated By: Francene Boyers, M.D.     Anti-infectives: Anti-infectives   None      Assessment/Plan: Status post laparoscopic cholecystectomy with a small right apical pneumothorax probably secondary to retching. Chest x-ray is pending this morning but her lungs are clear and O2 saturations are 100%. She still has quite a bit of chest pain which I expect may be musculoskeletal secondary to muscle strain or possible rib fracture not seen on chest x-ray. No evidence of specific abdominal complication. Will keep in the hospital today for pain control and repeat chest x-ray.    LOS: 1 day    Elizabeth Dominguez T 01/16/2013

## 2013-01-16 NOTE — Progress Notes (Signed)
Nutrition Brief Note  Patient identified on the Malnutrition Screening Tool (MST) Report  Body mass index is 27.69 kg/(m^2). Patient meets criteria for overweight based on current BMI.  Ht:5'9'' Wt: 187 lbs.  Current diet order is full liquid. Pt reports that she has been doing well with her clear liquid diet and had been advanced to a full liquid diet. Labs and medications reviewed.   Pt underwent recent cholecystectomy. She said that she was not eating anything but fruit and toast for about a week while waiting for surgery.She reported that she had been working out with a Systems analyst to lose weight prior to having gallbladder problems. She reported her previous weight of 207 lbs in April of 2013. She said that she has gotten down to 185 lbs and was trying to continue weight loss to weight 165 lbs. She refused nutritional supplements.  No nutrition interventions warranted at this time. If nutrition issues arise, please consult RD.   Ebbie Latus RD, LDN

## 2013-01-17 ENCOUNTER — Inpatient Hospital Stay (HOSPITAL_COMMUNITY): Payer: Managed Care, Other (non HMO)

## 2013-01-17 NOTE — Discharge Summary (Signed)
   Patient ID: Elizabeth Dominguez [id number] 52 y.o. 1961-02-19  01/15/2013  Discharge date and time: 01/17/2013   Admitting Physician: Avel Peace  Discharge Physician: Glenna Fellows T  Admission Diagnoses: Pneumothorax on right [512.89]  Discharge Diagnoses: same  Operations: none  Admission Condition: fair  Discharged Condition: good  Indication for Admission: patient is a 52 year old female who was discharged the same day following laparoscopic cholecystectomy. She developed severe nausea and retching and then suddenly right-sided chest pain. She was evaluated in the emergency room and found to have a small apical right pneumothorax. Her abdomen was benign. Lab work was unremarkable. She was admitted for observation.   Hospital Course: On the following day chest x-ray showed resolution of her pneumothorax although she still had some significant sharp right-sided chest pain. The day after on the day of discharge her pain is significantly improved and she is just sore in her chest and abdomen. No GI complaints and tolerating diet. She is felt ready for discharge.   Disposition: Home  Patient Instructions:    Medication List    TAKE these medications       acetaminophen 500 MG tablet  Commonly known as:  TYLENOL  Take 1,000 mg by mouth every 6 (six) hours as needed for pain.     diclofenac sodium 1 % Gel  Commonly known as:  VOLTAREN  Apply 2 g topically 4 (four) times daily as needed (for joint pain.--FOR TENDONITIS RT ELBOW).     docusate sodium 100 MG capsule  Commonly known as:  COLACE  Take 200 mg by mouth daily as needed for constipation.     fexofenadine 180 MG tablet  Commonly known as:  ALLEGRA  Take 180 mg by mouth daily as needed (for allergies).     HYDROcodone-acetaminophen 5-325 MG per tablet  Commonly known as:  NORCO/VICODIN  Take 2 tablets by mouth once.     multivitamin with minerals Tabs  Take 1 tablet by mouth every morning.     oxyCODONE-acetaminophen 5-325 MG per tablet  Commonly known as:  ROXICET  Take 1-2 tablets by mouth every 4 (four) hours as needed for pain.     PEPCID COMPLETE PO  Take 1 tablet by mouth daily as needed (for heart burn). ONCE A DAY IF NEEDED        Activity: activity as tolerated Diet: regular diet Wound Care: none needed  Follow-up:  With Dr. Donell Beers in 2 weeks.  Signed: Mariella Saa MD, FACS  01/17/2013, 7:27 AM

## 2013-01-17 NOTE — Progress Notes (Signed)
Patient ID: Elizabeth Dominguez, female   DOB: 1960/10/08, 52 y.o.   MRN: [id number]    Subjective: Feels better. Sore at umbilicus and right posterior chest but no further sharp pain. No shortness of breath. Tolerating regular diet without nausea.  Objective: Vital signs in last 24 hours: Temp:  [98.2 F (36.8 C)-98.9 F (37.2 C)] 98.5 F (36.9 C) (04/19 0559) Pulse Rate:  [55-105] 58 (04/19 0559) Resp:  [18-20] 18 (04/19 0559) BP: (100-110)/(45-69) 102/69 mmHg (04/19 0559) SpO2:  [99 %-100 %] 100 % (04/19 0559) Last BM Date: 01/14/13  Intake/Output from previous day: 04/18 0701 - 04/19 0700 In: 2662.1 [P.O.:1080; I.V.:1582.1] Out: 3900 [Urine:3900] Intake/Output this shift:    General appearance: alert, cooperative and no distress Lungs: Clear without increased work of breathing Abdomen: Mild periumbilical tenderness. Incisions okay.  Lab Results:   Recent Labs  01/15/13 1730  WBC 9.1  HGB 12.8  HCT 36.0  PLT 183   BMET  Recent Labs  01/15/13 1730  NA 136  K 3.9  CL 101  CO2 27  GLUCOSE 179*  BUN 11  CREATININE 0.76  CALCIUM 9.2     Studies/Results: Dg Chest 2 View  01/16/2013  *RADIOLOGY REPORT*  Clinical Data: Right pneumothorax  CHEST - 2 VIEW  Comparison: 01/15/2013  Findings: The tiny right apical pneumothorax identified on the prior exam is no longer visualized.  Heart and mediastinal contours appear within normal limits.  The lung fields demonstrate linear atelectasis at the left base and are otherwise clear.  No pleural fluid is seen.  Residual contrast is noted within bowel loops in the upper abdomen.  IMPRESSION: Interval clearing of the right pneumothorax.  Subsegmental atelectasis at the left base.   Original Report Authenticated By: Rhodia Albright, M.D.    Dg Cholangiogram Operative  01/15/2013  *RADIOLOGY REPORT*  Clinical Data:   Cholecystectomy for cholelithiasis.  INTRAOPERATIVE CHOLANGIOGRAM  Technique:  Cholangiographic images from the  C-arm fluoroscopic device were submitted for interpretation post-operatively.  Please see the procedural report for the amount of contrast and the fluoroscopy time utilized.  Comparison:  Abdominal ultrasound dated 12/25/2012  Findings:  Intraoperative imaging obtained with a C-arm shows a relatively low cystic duct insertion.  There is subtle suggestion of a filling defect in the common duct just below the cystic duct insertion which changes over time and is felt to most likely represents one or more air bubbles.  Contrast does flow freely into the duodenum.  No extravasation of contrast identified.  Numerous partially calcified gallstones are visualized in the gallbladder.  IMPRESSION: Low cystic duct insertion and suggestion of filling defect in the common duct which changes over time and is most likely representative of one or more air bubbles.   Original Report Authenticated By: Irish Lack, M.D.    Dg Chest Port 1 View  01/15/2013  *RADIOLOGY REPORT*  Clinical Data: Chest pain.  PORTABLE CHEST - 1 VIEW  Comparison: 12/25/2012  Findings: There is a tiny right apical pneumothorax with slight atelectasis at the right lung base.  Heart size and vascularity are normal.  Left lung is clear.  IMPRESSION: Tiny right apical pneumothorax.   Original Report Authenticated By: Francene Boyers, M.D.    Chest x-ray April 19 not yet read but appears to have no recurrent pneumothorax or other abnormality  Anti-infectives: Anti-infectives   None      Assessment/Plan: Small pneumothorax and chest pain Post laparoscopic cholecystectomy with retching. Resolved clinically. Okay for discharge.  LOS: 2 days    Curstin Schmale T 01/17/2013

## 2013-01-19 NOTE — Progress Notes (Signed)
Utilization review completed.  

## 2013-02-02 ENCOUNTER — Encounter (INDEPENDENT_AMBULATORY_CARE_PROVIDER_SITE_OTHER): Payer: Managed Care, Other (non HMO) | Admitting: Surgery

## 2013-02-06 ENCOUNTER — Encounter (INDEPENDENT_AMBULATORY_CARE_PROVIDER_SITE_OTHER): Payer: Self-pay | Admitting: General Surgery

## 2013-02-06 ENCOUNTER — Ambulatory Visit (INDEPENDENT_AMBULATORY_CARE_PROVIDER_SITE_OTHER): Payer: Managed Care, Other (non HMO) | Admitting: General Surgery

## 2013-02-06 VITALS — BP 116/78 | HR 74 | Temp 98.5°F | Resp 14 | Ht 69.0 in | Wt 187.8 lb

## 2013-02-06 DIAGNOSIS — K8020 Calculus of gallbladder without cholecystitis without obstruction: Secondary | ICD-10-CM

## 2013-02-06 NOTE — Patient Instructions (Signed)
Follow up in 2-3 weeks if hemorrhoid is not better.    Otherwise follow up as needed.

## 2013-02-06 NOTE — Assessment & Plan Note (Signed)
Pt doing better after lap chole.  She has recovered from a shortness of breath standpoint.  I advised her to take stool softeners and over the counter laxatives for constipation.

## 2013-02-06 NOTE — Progress Notes (Signed)
HISTORY: Pt is s/p lap chole.  She has issues post op with nausea and vomiting.  She was sent home from short stay and had many episodes of nausea and vomiting.  She developed shortness of breath and came to the ED.  She was found to have a pneumothorax.  She did not require chest tube.  Her pain and nausea from pre op are resolved.  She never got pain meds filled.  She is eating much better.  The subxiphoid incision is still a little sore.      EXAM: General:  Alert and oriented. Incision:  Healing well.     PATHOLOGY: Chronic cholecystitis and cholelithiasis.     ASSESSMENT AND PLAN:   Symptomatic cholelithiasis Pt doing better after lap chole.  She has recovered from a shortness of breath standpoint.  I advised her to take stool softeners and over the counter laxatives for constipation.        Elizabeth Diego, MD Surgical Oncology, General & Endocrine Surgery [facility]  No PCP Per Patient No ref. provider found

## 2013-02-24 ENCOUNTER — Telehealth (INDEPENDENT_AMBULATORY_CARE_PROVIDER_SITE_OTHER): Payer: Self-pay

## 2013-02-24 ENCOUNTER — Other Ambulatory Visit (INDEPENDENT_AMBULATORY_CARE_PROVIDER_SITE_OTHER): Payer: Self-pay

## 2013-02-24 ENCOUNTER — Telehealth (INDEPENDENT_AMBULATORY_CARE_PROVIDER_SITE_OTHER): Payer: Self-pay | Admitting: General Surgery

## 2013-02-24 ENCOUNTER — Other Ambulatory Visit (INDEPENDENT_AMBULATORY_CARE_PROVIDER_SITE_OTHER): Payer: Self-pay | Admitting: General Surgery

## 2013-02-24 DIAGNOSIS — R110 Nausea: Secondary | ICD-10-CM

## 2013-02-24 DIAGNOSIS — R1013 Epigastric pain: Secondary | ICD-10-CM

## 2013-02-24 LAB — CBC WITH DIFFERENTIAL/PLATELET
Basophils Relative: 0 % (ref 0–1)
Eosinophils Absolute: 0.1 10*3/uL (ref 0.0–0.7)
Eosinophils Relative: 2 % (ref 0–5)
Hemoglobin: 13.1 g/dL (ref 12.0–15.0)
MCH: 33.6 pg (ref 26.0–34.0)
MCHC: 35.5 g/dL (ref 30.0–36.0)
Monocytes Relative: 6 % (ref 3–12)
Neutrophils Relative %: 72 % (ref 43–77)
Platelets: 189 10*3/uL (ref 150–400)

## 2013-02-24 LAB — COMPREHENSIVE METABOLIC PANEL
AST: 15 U/L (ref 0–37)
Albumin: 4.3 g/dL (ref 3.5–5.2)
Alkaline Phosphatase: 68 U/L (ref 39–117)
Potassium: 4.1 mEq/L (ref 3.5–5.3)
Sodium: 139 mEq/L (ref 135–145)
Total Protein: 6.4 g/dL (ref 6.0–8.3)

## 2013-02-24 NOTE — Telephone Encounter (Signed)
Get CMET, CBC w diff tx FB

## 2013-02-24 NOTE — Telephone Encounter (Signed)
Patient calling status post lap chole on 01/14/2013. She states on Sunday morning she had some pancakes and sausage and started getting a sharp pain in her mid upper abdomen like prior to her surgery. She states it was not as severe of a pain but it felt the same. She states this pain has gotten worse and better since Sunday morning but it is never gone completely away. She has some nausea but no vomiting. She does not have a PCP or a GI MD. Please advise. [phone].

## 2013-02-24 NOTE — Telephone Encounter (Signed)
Spoke with pt and sent her to [facility] labs for CBC w/ diff and CMET.  We will call her when results are available.

## 2013-03-06 NOTE — Progress Notes (Signed)
Quick Note:  Please let pt know labs are normal. ______

## 2013-03-09 NOTE — Telephone Encounter (Signed)
Contacted pt with lab results.  She states she is feeling better and the epigastric pain has gone.  She did say she was on a two week trial of Prilosec.  I asked her if her PCP had recommended this regimen and she said she has not contacted him/her about this problem.  I suggested that if this occurs again she should definitely see her PCP.  She also mentioned that she self diagnosed her symptoms from information she read on the internet.

## 2013-05-14 ENCOUNTER — Other Ambulatory Visit: Payer: Self-pay

## 2013-05-14 DIAGNOSIS — Z1231 Encounter for screening mammogram for malignant neoplasm of breast: Secondary | ICD-10-CM

## 2013-05-14 DIAGNOSIS — Z803 Family history of malignant neoplasm of breast: Secondary | ICD-10-CM

## 2013-05-21 ENCOUNTER — Ambulatory Visit
Admission: RE | Admit: 2013-05-21 | Discharge: 2013-05-21 | Disposition: A | Discharge status: Home / Self Care | Payer: Managed Care, Other (non HMO) | Source: Ambulatory Visit

## 2013-05-21 DIAGNOSIS — Z803 Family history of malignant neoplasm of breast: Secondary | ICD-10-CM

## 2013-05-21 DIAGNOSIS — Z1231 Encounter for screening mammogram for malignant neoplasm of breast: Secondary | ICD-10-CM

## 2013-05-28 ENCOUNTER — Other Ambulatory Visit: Payer: Self-pay | Admitting: Obstetrics and Gynecology

## 2013-05-28 DIAGNOSIS — R922 Inconclusive mammogram: Secondary | ICD-10-CM

## 2013-08-10 ENCOUNTER — Other Ambulatory Visit: Payer: Managed Care, Other (non HMO)

## 2014-06-14 ENCOUNTER — Encounter: Payer: Self-pay | Admitting: Internal Medicine

## 2014-06-18 ENCOUNTER — Encounter: Payer: Self-pay | Admitting: *Deleted

## 2014-08-17 ENCOUNTER — Encounter (HOSPITAL_COMMUNITY): Payer: Self-pay

## 2014-08-17 ENCOUNTER — Emergency Department (HOSPITAL_COMMUNITY)
Admission: EM | Admit: 2014-08-17 | Discharge: 2014-08-17 | Disposition: A | Discharge status: Home / Self Care | Payer: BC Managed Care – PPO | Attending: Emergency Medicine | Admitting: Emergency Medicine

## 2014-08-17 DIAGNOSIS — Z8719 Personal history of other diseases of the digestive system: Secondary | ICD-10-CM | POA: Diagnosis not present

## 2014-08-17 DIAGNOSIS — Z85828 Personal history of other malignant neoplasm of skin: Secondary | ICD-10-CM | POA: Diagnosis not present

## 2014-08-17 DIAGNOSIS — Z79899 Other long term (current) drug therapy: Secondary | ICD-10-CM | POA: Insufficient documentation

## 2014-08-17 DIAGNOSIS — R1013 Epigastric pain: Secondary | ICD-10-CM

## 2014-08-17 DIAGNOSIS — Z8639 Personal history of other endocrine, nutritional and metabolic disease: Secondary | ICD-10-CM | POA: Diagnosis not present

## 2014-08-17 DIAGNOSIS — Z90710 Acquired absence of both cervix and uterus: Secondary | ICD-10-CM | POA: Diagnosis not present

## 2014-08-17 DIAGNOSIS — M545 Low back pain: Secondary | ICD-10-CM | POA: Diagnosis not present

## 2014-08-17 DIAGNOSIS — Z8659 Personal history of other mental and behavioral disorders: Secondary | ICD-10-CM | POA: Diagnosis not present

## 2014-08-17 DIAGNOSIS — Z9049 Acquired absence of other specified parts of digestive tract: Secondary | ICD-10-CM | POA: Diagnosis not present

## 2014-08-17 LAB — CBC
HCT: 39.5 % (ref 36.0–46.0)
Hemoglobin: 13.8 g/dL (ref 12.0–15.0)
MCH: 32.5 pg (ref 26.0–34.0)
MCHC: 34.9 g/dL (ref 30.0–36.0)
MCV: 93.2 fL (ref 78.0–100.0)
Platelets: 183 10*3/uL (ref 150–400)
RBC: 4.24 MIL/uL (ref 3.87–5.11)
RDW: 12.2 % (ref 11.5–15.5)
WBC: 7.3 10*3/uL (ref 4.0–10.5)

## 2014-08-17 LAB — COMPREHENSIVE METABOLIC PANEL
ALT: 11 U/L (ref 0–35)
AST: 20 U/L (ref 0–37)
Albumin: 4.1 g/dL (ref 3.5–5.2)
Alkaline Phosphatase: 80 U/L (ref 39–117)
Anion gap: 11 (ref 5–15)
BUN: 12 mg/dL (ref 6–23)
CO2: 25 mEq/L (ref 19–32)
Calcium: 10.3 mg/dL (ref 8.4–10.5)
Chloride: 104 mEq/L (ref 96–112)
Creatinine, Ser: 0.8 mg/dL (ref 0.50–1.10)
GFR calc Af Amer: >90 mL/min (ref >90)
GFR calc non Af Amer: 83 mL/min — ABNORMAL LOW (ref >90)
Glucose, Bld: 97 mg/dL (ref 70–99)
Potassium: 4.2 mEq/L (ref 3.7–5.3)
Sodium: 140 mEq/L (ref 137–147)
Total Bilirubin: 0.6 mg/dL (ref 0.3–1.2)
Total Protein: 7.2 g/dL (ref 6.0–8.3)

## 2014-08-17 LAB — LIPASE, BLOOD: Lipase: 31 U/L (ref 11–59)

## 2014-08-17 MED ORDER — FAMOTIDINE 20 MG PO TABS: 20.0000 mg | ORAL_TABLET | Freq: Two times a day (BID) | ORAL | Status: DC

## 2014-08-17 MED ORDER — PANTOPRAZOLE SODIUM 40 MG IV SOLR
40.0000 mg | Freq: Once | INTRAVENOUS | Status: AC
  Administered 2014-08-17: 40 mg via INTRAVENOUS
  Filled 2014-08-17: qty 40

## 2014-08-17 MED ORDER — GI COCKTAIL ~~LOC~~
30.0000 mL | Freq: Once | ORAL | Status: AC
  Administered 2014-08-17: 30 mL via ORAL
  Filled 2014-08-17: qty 30

## 2014-08-17 MED ORDER — FAMOTIDINE IN NACL 20-0.9 MG/50ML-% IV SOLN
20.0000 mg | Freq: Once | INTRAVENOUS | Status: AC
  Administered 2014-08-17: 20 mg via INTRAVENOUS
  Filled 2014-08-17: qty 50

## 2014-08-17 NOTE — Discharge Instructions (Signed)
Abdominal Pain, Women °Abdominal (stomach, pelvic, or belly) pain can be caused by many things. It is important to tell your doctor: °· The location of the pain. °· Does it come and go or is it present all the time? °· Are there things that start the pain (eating certain foods, exercise)? °· Are there other symptoms associated with the pain (fever, nausea, vomiting, diarrhea)? °All of this is helpful to know when trying to find the cause of the pain. °CAUSES  °· Stomach: virus or bacteria infection, or ulcer. °· Intestine: appendicitis (inflamed appendix), regional ileitis (Crohn's disease), ulcerative colitis (inflamed colon), irritable bowel syndrome, diverticulitis (inflamed diverticulum of the colon), or cancer of the stomach or intestine. °· Gallbladder disease or stones in the gallbladder. °· Kidney disease, kidney stones, or infection. °· Pancreas infection or cancer. °· Fibromyalgia (pain disorder). °· Diseases of the female organs: °¨ Uterus: fibroid (non-cancerous) tumors or infection. °¨ Fallopian tubes: infection or tubal pregnancy. °¨ Ovary: cysts or tumors. °¨ Pelvic adhesions (scar tissue). °¨ Endometriosis (uterus lining tissue growing in the pelvis and on the pelvic organs). °¨ Pelvic congestion syndrome (female organs filling up with blood just before the menstrual period). °¨ Pain with the menstrual period. °¨ Pain with ovulation (producing an egg). °¨ Pain with an IUD (intrauterine device, birth control) in the uterus. °¨ Cancer of the female organs. °· Functional pain (pain not caused by a disease, may improve without treatment). °· Psychological pain. °· Depression. °DIAGNOSIS  °Your doctor will decide the seriousness of your pain by doing an examination. °· Blood tests. °· X-rays. °· Ultrasound. °· CT scan (computed tomography, special type of X-ray). °· MRI (magnetic resonance imaging). °· Cultures, for infection. °· Barium enema (dye inserted in the large intestine, to better view it with  X-rays). °· Colonoscopy (looking in intestine with a lighted tube). °· Laparoscopy (minor surgery, looking in abdomen with a lighted tube). °· Major abdominal exploratory surgery (looking in abdomen with a large incision). °TREATMENT  °The treatment will depend on the cause of the pain.  °· Many cases can be observed and treated at home. °· Over-the-counter medicines recommended by your caregiver. °· Prescription medicine. °· Antibiotics, for infection. °· Birth control pills, for painful periods or for ovulation pain. °· Hormone treatment, for endometriosis. °· Nerve blocking injections. °· Physical therapy. °· Antidepressants. °· Counseling with a psychologist or psychiatrist. °· Minor or major surgery. °HOME CARE INSTRUCTIONS  °· Do not take laxatives, unless directed by your caregiver. °· Take over-the-counter pain medicine only if ordered by your caregiver. Do not take aspirin because it can cause an upset stomach or bleeding. °· Try a clear liquid diet (broth or water) as ordered by your caregiver. Slowly move to a bland diet, as tolerated, if the pain is related to the stomach or intestine. °· Have a thermometer and take your temperature several times a day, and record it. °· Bed rest and sleep, if it helps the pain. °· Avoid sexual intercourse, if it causes pain. °· Avoid stressful situations. °· Keep your follow-up appointments and tests, as your caregiver orders. °· If the pain does not go away with medicine or surgery, you may try: °¨ Acupuncture. °¨ Relaxation exercises (yoga, meditation). °¨ Group therapy. °¨ Counseling. °SEEK MEDICAL CARE IF:  °· You notice certain foods cause stomach pain. °· Your home care treatment is not helping your pain. °· You need stronger pain medicine. °· You want your IUD removed. °· You feel faint or   lightheaded. °· You develop nausea and vomiting. °· You develop a rash. °· You are having side effects or an allergy to your medicine. °SEEK IMMEDIATE MEDICAL CARE IF:  °· Your  pain does not go away or gets worse. °· You have a fever. °· Your pain is felt only in portions of the abdomen. The right side could possibly be appendicitis. The left lower portion of the abdomen could be colitis or diverticulitis. °· You are passing blood in your stools (bright red or black tarry stools, with or without vomiting). °· You have blood in your urine. °· You develop chills, with or without a fever. °· You pass out. °MAKE SURE YOU:  °· Understand these instructions. °· Will watch your condition. °· Will get help right away if you are not doing well or get worse. °Document Released: 07/15/2007 Document Revised: 02/01/2014 Document Reviewed: 08/04/2009 °[employer]® Patient Information ©2015 [employer]. This information is not intended to replace advice given to you by your health care provider. Make sure you discuss any questions you have with your health care provider. ° °

## 2014-08-17 NOTE — ED Provider Notes (Signed)
CSN: [id number]     Arrival date & time 08/17/14  1125 History   First MD Initiated Contact with Patient 08/17/14 1145     Chief Complaint  Patient presents with  . Abdominal Pain     (Consider location/radiation/quality/duration/timing/severity/associated sxs/prior Treatment) HPI Pt is a 53yo female with hx of symptomatic cholelithiasis, s/p cholecystectomy April of 2014, presenting to ED via EMS with c/o sudden onset of severe epigastric pain while at work with mild nausea.  Pt states pain felt similar to her gall bladder attacks. Pain subsided by the time EMS arrived but pt states when she stood up she felt the pain again, then pain was relieved when she lied down in the ambulance. Pain does not radiate into chest or back.  No pain medication PTA. Denies fever, chills, vomiting. Does report hx of intermittent diarrhea and constipation since having gall bladder removed, currently followed by GI. Denies chest pain, palpitations, or SOB.  Denies hx of pancreatitis.   Past Medical History  Diagnosis Date  . Cancer     skin  . Complication of anesthesia     PT STATES SHE GETS ANXIOUS BEFORE SURGERIES AND HAS NAUSEA BEFORE SURGERY.  Marland Kitchen Anxiety     PANIC ATTACKS  . Gallstones     ABD PAIN, AND NAUSEA  . Low blood sugar     USUALLY  . Fainting episodes     YRS AGO --NO CAUSE FOUND- POSS SEIZURES--TOOK TOPAMAX FOR A WHILE--BUT OFF THE MEDICINE NOW FOR 7 YEARS AND NO PROBLEM SINCE   Past Surgical History  Procedure Laterality Date  . Nasal septum surgery  1987, 1998  . Abdominal hysterectomy  2008  . Shoulder arthroscopy      left  . Incontinence surgery  2010    BLADDER SLING  . Umbilical hernia repair  10/18/11  . Hernia repair  10/2011    umb hernia repair  . Cholecystectomy  01/15/13  . Cholecystectomy N/A 01/15/2013    Procedure: LAPAROSCOPIC CHOLECYSTECTOMY WITH INTRAOPERATIVE CHOLANGIOGRAM;  Surgeon: Stark Klein, MD;  Location: [facility];  Service: General;  Laterality: N/A;    Family History  Problem Relation Age of Onset  . Hypertension Mother   . Breast cancer Mother 73  . Skin cancer Father   . Parkinson's disease Father   . Heart disease Maternal Grandfather   . Diabetes Sister    History  Substance Use Topics  . Smoking status: Never Smoker   . Smokeless tobacco: Never Used  . Alcohol Use: Yes     Comment: occ   OB History    No data available     Review of Systems  Constitutional: Negative for fever and chills.  Respiratory: Negative for cough and shortness of breath.   Gastrointestinal: Positive for abdominal pain. Negative for nausea, vomiting, diarrhea, constipation and blood in stool.  Genitourinary: Negative for dysuria, hematuria and flank pain.  Musculoskeletal: Positive for back pain ( mild lower back pain). Negative for myalgias.  All other systems reviewed and are negative.     Allergies  Dilaudid  Home Medications   Prior to Admission medications   Medication Sig Start Date End Date Taking? Authorizing Provider  acetaminophen (TYLENOL) 500 MG tablet Take 1,000 mg by mouth every 6 (six) hours as needed for pain.   Yes Historical Provider, MD  calcium carbonate (TUMS - DOSED IN MG ELEMENTAL CALCIUM) 500 MG chewable tablet Chew 3 tablets by mouth once.   Yes Historical Provider, MD  fexofenadine Delma Freeze)  180 MG tablet Take 180 mg by mouth daily as needed (for allergies).   Yes Historical Provider, MD  Multiple Vitamin (MULTIVITAMIN WITH MINERALS) TABS Take 1 tablet by mouth every morning.    Yes Historical Provider, MD  Nasal Dilators (BREATHE RIGHT EXTRA) STRP 1 each by Does not apply route at bedtime.   Yes Historical Provider, MD  pseudoephedrine (SUDAFED) 60 MG tablet Take 60 mg by mouth every 4 (four) hours as needed for congestion.   Yes Historical Provider, MD  famotidine (PEPCID) 20 MG tablet Take 1 tablet (20 mg total) by mouth 2 (two) times daily. 08/17/14   Noland Fordyce, PA-C   BP 130/76 mmHg  Pulse 67   Temp(Src) 97.6 F (36.4 C) (Oral)  Resp 18  SpO2 100% Physical Exam  Constitutional: She appears well-developed and well-nourished. No distress.  Pt lying flat on exam bed, NAD  HENT:  Head: Normocephalic and atraumatic.  Eyes: Conjunctivae are normal. No scleral icterus.  Neck: Normal range of motion.  Cardiovascular: Normal rate, regular rhythm and normal heart sounds.   Pulmonary/Chest: Effort normal and breath sounds normal. No respiratory distress. She has no wheezes. She has no rales. She exhibits no tenderness.  Abdominal: Soft. Bowel sounds are normal. She exhibits no distension and no mass. There is no tenderness. There is no rebound and no guarding.  Soft, non-distended. Mild tenderness in epigastrium. No rebound, guarding or masses. No CVAT  Musculoskeletal: Normal range of motion.  Neurological: She is alert.  Skin: Skin is warm and dry. She is not diaphoretic.  Nursing note and vitals reviewed.   ED Course  Procedures (including critical care time) Labs Review Labs Reviewed  COMPREHENSIVE METABOLIC PANEL - Abnormal; Notable for the following:    GFR calc non Af Amer 83 (*)    All other components within normal limits  CBC  LIPASE, BLOOD    Imaging Review No results found.   EKG Interpretation None      MDM   Final diagnoses:  Epigastric pain    Pt with hx of cholecystectomy presenting to ED with c/o severe epigastric pain that started while at work. Pain improves with lying down.  Denies chest pain or SOB.  Pt have mild epigastric tenderness w/o rebound or guarding.  Labs: unremarkable. Vitals: unremarkable.  Pt declined pain medication in ED as symptoms resolved after lying flat.  Symptoms not consistent with ACS, not concerned for aortic dissection as pain is reproducible, does not radiate to back and pt hemodynamically stable.  Discussed pt with Dr. Doy Mince, symptoms c/w GERD vs gastritis or gastric ulcer. Will give GI cocktail, pepcid and prevacid.  Pt  requested multiple times to have something to drink.  Will give fluid challenge. Pt able to keep down several ounces of PO fluids, will discharge home. Home care instructions provided. Rx: pepcid. Advised to f/u with her PCP if symptoms not improving. Return precautions provided. Pt verbalized understanding and agreement with tx plan.     Noland Fordyce, PA-C 08/17/14 1523  Artis Delay, MD 08/20/14 [phone]

## 2014-08-17 NOTE — ED Notes (Signed)
Bed: WA08 Expected date:  Expected time:  Means of arrival:  Comments: EMS abdominal pain 

## 2014-08-17 NOTE — ED Notes (Signed)
Pt presents via EMS with c/o sudden onset abdominal pain. Pt reports that she was at work and started to feel the pain in her upper abdomen. Pt reports she has had this pain before but it was related to her gallbladder which has since been removed. Pt denies chest pain or shortness of breath.

## 2014-08-24 ENCOUNTER — Other Ambulatory Visit: Payer: Self-pay | Admitting: Obstetrics and Gynecology

## 2014-08-25 ENCOUNTER — Ambulatory Visit (INDEPENDENT_AMBULATORY_CARE_PROVIDER_SITE_OTHER): Payer: BC Managed Care – PPO | Admitting: Internal Medicine

## 2014-08-25 ENCOUNTER — Encounter: Payer: Self-pay | Admitting: Internal Medicine

## 2014-08-25 VITALS — BP 110/68 | HR 74 | Ht 69.0 in | Wt 192.8 lb

## 2014-08-25 DIAGNOSIS — R1013 Epigastric pain: Secondary | ICD-10-CM

## 2014-08-25 DIAGNOSIS — K5901 Slow transit constipation: Secondary | ICD-10-CM

## 2014-08-25 NOTE — Patient Instructions (Addendum)
You will be due for a recall colonoscopy in 11/2014. We will send you a reminder in the mail when it gets closer to that time.  Please purchase the following medications over the counter and take as directed: Milk of Magnesia 1 tablespoon daily OR Magnesium oxide 500 mg twice daily OR Senokot 2 tablets daily  You have been scheduled for a CT scan of the abdomen and pelvis at [facility] ([address]---this is in the same building as Press [facility]).   You are scheduled on Tuesday 09/01/14 at 1:30 pm. You should arrive 15 minutes prior to your appointment time for registration. Please follow the written instructions below on the day of your exam:  WARNING: IF YOU ARE ALLERGIC TO IODINE/X-RAY DYE, PLEASE NOTIFY RADIOLOGY IMMEDIATELY AT [phone]! YOU WILL BE GIVEN A 13 HOUR PREMEDICATION PREP.  1) Do not eat or drink anything after 9:30 am (4 hours prior to your test) 2) You have been given 1 bottle of oral contrast to drink. The solution may taste better if refrigerated, but do NOT add ice or any other liquid to this solution. Shake             well before drinking.    Drink 1 bottle of contrast @ 12:30 pm (1 hour prior to your exam)  You may take any medications as prescribed with a small amount of water except for the following: Metformin, Glucophage, Glucovance, Avandamet, Riomet, Fortamet, Actoplus Met, Janumet, Glumetza or Metaglip. The above medications must be held the day of the exam AND 48 hours after the exam.  The purpose of you drinking the oral contrast is to aid in the visualization of your intestinal tract. The contrast solution may cause some diarrhea. Before your exam is started, you will be given a small amount of fluid to drink. Depending on your individual set of symptoms, you may also receive an intravenous injection of x-ray contrast/dye. Plan on being at [facility] for 30 minutes or long, depending on the type of exam you are having  performed.  This test typically takes 30-45 minutes to complete.  If you have any questions regarding your exam or if you need to reschedule, you may call the CT department at [phone] between the hours of 8:00 am and 5:00 pm, Monday-Friday.  ________________________________________________________________________

## 2014-08-25 NOTE — Progress Notes (Signed)
Elizabeth Dominguez December 22, 1960 [id number]  Note: This dictation was prepared with Dragon digital system. Any transcriptional errors that result from this procedure are unintentional.   History of Present Illness:  This is a 53 year old white female with intermittent constipation as well as diarrhea. The constipation is more predominant. Her bowel habits changed after laparoscopic cholecystectomy in April 2014 for cholelithiasis. Prior to that, in January 2013, she had an incarcerated umbilical hernia which was reduced surgically. Separate from these problems, she developed acute upper abdominal pain 2 weeks ago while at work which was a sharp piercing pain as she was trying to get up from a chair. She had to lay down and the pain eased off. It really appeared as she tried to stand up. This happened about 3 times. She finally was taken to the emergency room by paramedics were she was given a GI cocktail and pain subsided. Her labs are normal including lipase, liver function test and CBC. She still has remaining tenderness in the epigastrium. She has intentionally lost about 12 pounds. She denies any heartburn or indigestion but has food intolerance to fried foods. She has never had a colonoscopy. There is no family history of colon cancer.    Past Medical History  Diagnosis Date  . Cancer     skin  . Complication of anesthesia     PT STATES SHE GETS ANXIOUS BEFORE SURGERIES AND HAS NAUSEA BEFORE SURGERY.  Marland Kitchen Anxiety     PANIC ATTACKS  . Gallstones     ABD PAIN, AND NAUSEA  . Low blood sugar     USUALLY  . Fainting episodes     YRS AGO --NO CAUSE FOUND- POSS SEIZURES--TOOK TOPAMAX FOR A WHILE--BUT OFF THE MEDICINE NOW FOR 7 YEARS AND NO PROBLEM SINCE    Past Surgical History  Procedure Laterality Date  . Nasal septum surgery  1987, 1998  . Abdominal hysterectomy  2008  . Shoulder arthroscopy      left  . Incontinence surgery  2010    BLADDER SLING  . Umbilical hernia repair  10/18/11  .  Hernia repair  10/2011    umb hernia repair  . Cholecystectomy  01/15/13  . Cholecystectomy N/A 01/15/2013    Procedure: LAPAROSCOPIC CHOLECYSTECTOMY WITH INTRAOPERATIVE CHOLANGIOGRAM;  Surgeon: Stark Klein, MD;  Location: [facility];  Service: General;  Laterality: N/A;    Allergies  Allergen Reactions  . Dilaudid [Hydromorphone Hcl] Itching and Rash    Family history and social history have been reviewed.  Review of Systems: Denies heartburn, reflux. Positive for epigastric pain  The remainder of the 10 point ROS is negative except as outlined in the H&P  Physical Exam: General Appearance Well developed, in no distress Eyes  Non icteric  HEENT  Non traumatic, normocephalic  Mouth No lesion, tongue papillated, no cheilosis Neck Supple without adenopathy, thyroid not enlarged, no carotid bruits, no JVD Lungs Clear to auscultation bilaterally COR Normal S1, normal S2, regular rhythm, no murmur, quiet precordium Abdomen postcholecystectomy scars. Normoactive bowel sounds. 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 Very tender just on the left chest over the hernia. It's very painful for her to sit up. Rest of the abdomen is normal Rectal small amount of soft Hemoccult negative stool Extremities  No pedal edema Skin No lesions Neurological Alert and oriented x 3 Psychological Normal mood and affect  Assessment and Plan:   Problem #61 53 year old white female with several separate problems. She had an acute episode of low epigastric  pain related to diathesis recti. Her pain is almost certainly abdominal wall pain. She is still very tender and I wonder if she has any underlying adhesions. Her symptoms are clearly precipitated by change in position. We will proceed with a CT scan of the abdomen to look for abnormalities in that area. She may have to follow up with Dr.Byerly for another opinion if it persists.  Problem #2 Constipation and diarrhea. Predominant constipation may be related to her  eating habits. She claims that she does not tolerate extra fiber or probiotics. I have suggested milk of magnesia 15-30 cc daily and as an alternative magnesium oxide 500 mg 1-2 tablets daily or Senokot. She has never had a colonoscopy at her age of 60 and I suggested that we go ahead and schedule it but she still has a lot of medical bills to pay from her cholecystectomy. We will send her a reminder later in February 2016 for screening colonoscopy.      Elizabeth Dominguez 08/25/2014

## 2014-08-30 LAB — CYTOLOGY - PAP

## 2014-08-31 ENCOUNTER — Ambulatory Visit (INDEPENDENT_AMBULATORY_CARE_PROVIDER_SITE_OTHER)
Admission: RE | Admit: 2014-08-31 | Discharge: 2014-08-31 | Disposition: A | Discharge status: Home / Self Care | Payer: BC Managed Care – PPO | Source: Ambulatory Visit | Attending: Internal Medicine | Admitting: Internal Medicine

## 2014-08-31 DIAGNOSIS — R1013 Epigastric pain: Secondary | ICD-10-CM

## 2014-08-31 MED ORDER — IOHEXOL 300 MG/ML  SOLN
80.0000 mL | Freq: Once | INTRAMUSCULAR | Status: AC | PRN
  Administered 2014-08-31: 80 mL via INTRAVENOUS

## 2014-11-04 ENCOUNTER — Encounter: Payer: Self-pay | Admitting: Internal Medicine

## 2015-02-28 IMAGING — CR DG CHEST 1V PORT
1 series · 1 of 1 positions shown · non-contrast
Comparison: 12/25/2012

CLINICAL DATA: Chest pain.

PORTABLE CHEST - 1 VIEW

[AP]
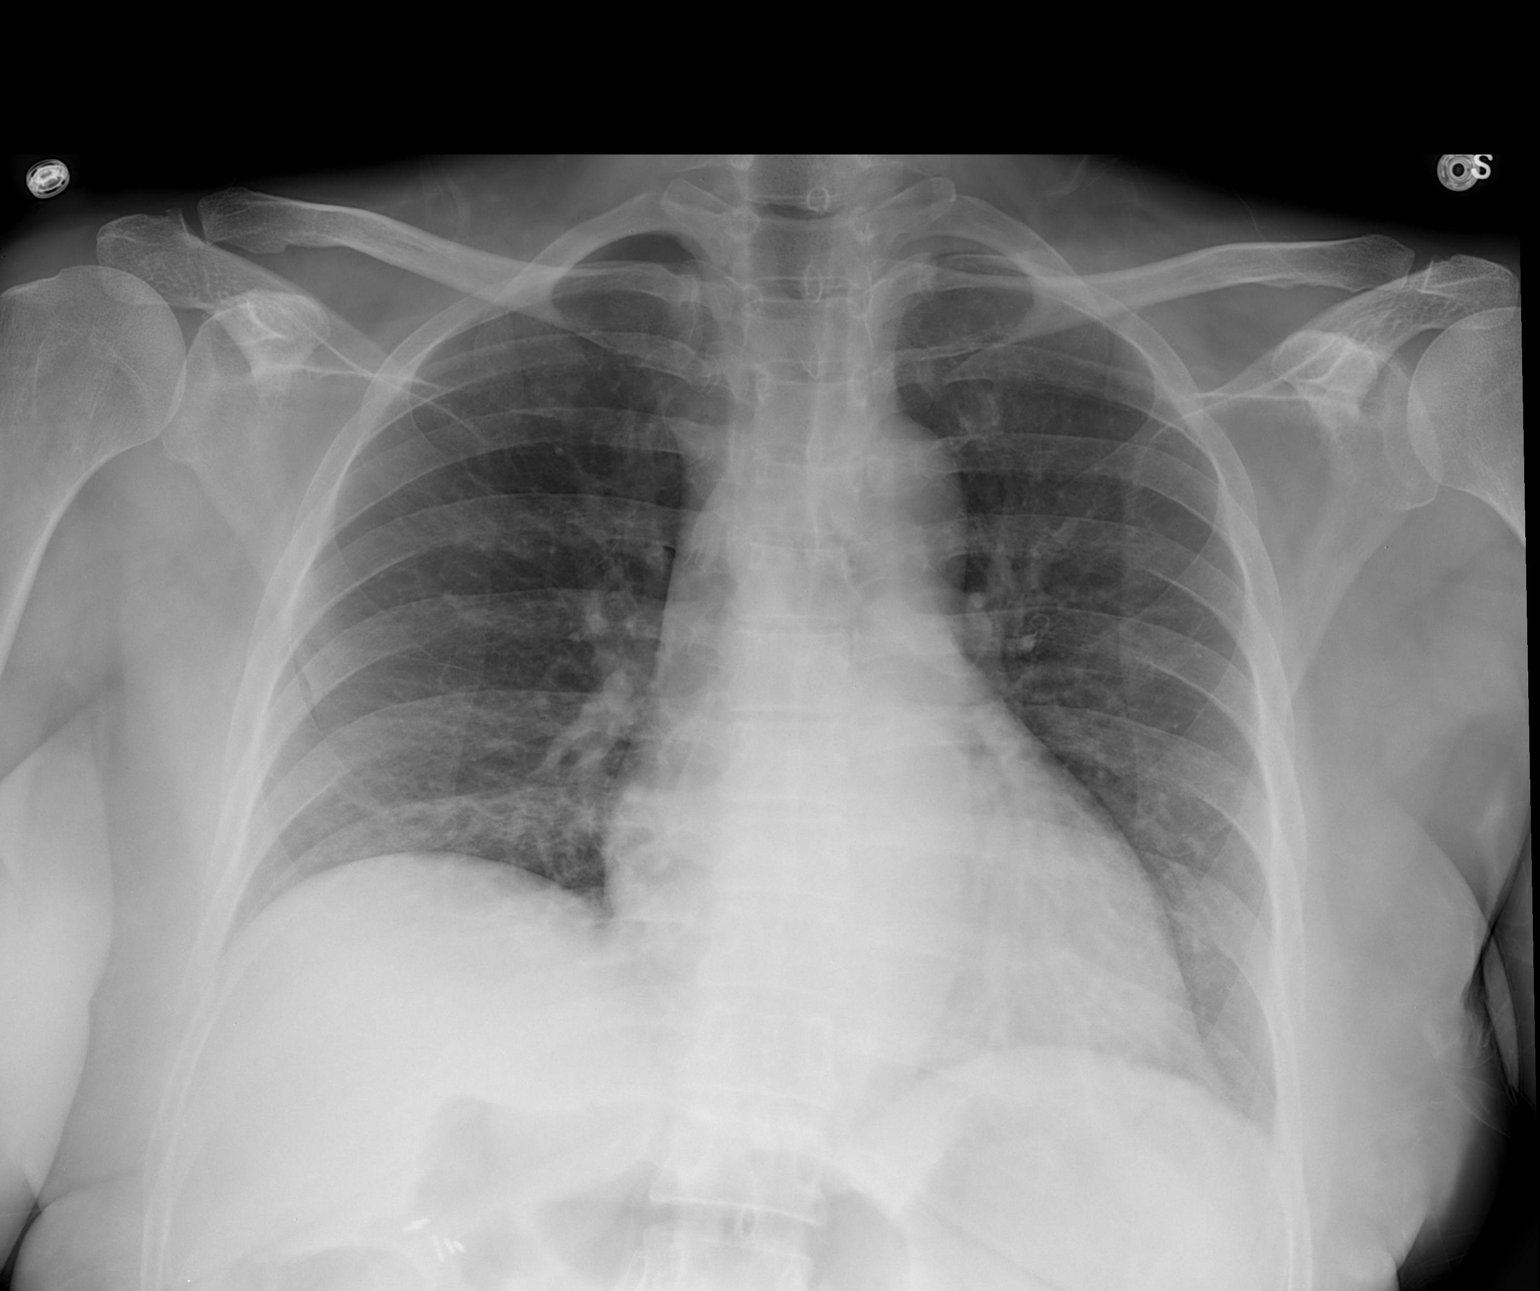

[1 of 1 positions shown; findings below may reference images not displayed]

FINDINGS: There is a tiny right apical pneumothorax with slight
atelectasis at the right lung base.  Heart size and vascularity are
normal.  Left lung is clear.
IMPRESSION: Tiny right apical pneumothorax.

## 2015-07-27 ENCOUNTER — Ambulatory Visit (INDEPENDENT_AMBULATORY_CARE_PROVIDER_SITE_OTHER): Payer: 59

## 2015-07-27 ENCOUNTER — Ambulatory Visit (INDEPENDENT_AMBULATORY_CARE_PROVIDER_SITE_OTHER): Payer: 59 | Admitting: Emergency Medicine

## 2015-07-27 VITALS — BP 122/78 | HR 85 | Temp 97.7°F | Resp 20 | Ht 69.0 in | Wt 197.8 lb

## 2015-07-27 DIAGNOSIS — M79671 Pain in right foot: Secondary | ICD-10-CM | POA: Diagnosis not present

## 2015-07-27 MED ORDER — HYDROCODONE-ACETAMINOPHEN 5-325 MG PO TABS: 1.0000 | ORAL_TABLET | ORAL | Status: DC | PRN

## 2015-07-27 MED ORDER — CELECOXIB 200 MG PO CAPS: 200.0000 mg | ORAL_CAPSULE | Freq: Every day | ORAL | Status: DC

## 2015-07-27 NOTE — Progress Notes (Signed)
Subjective:  Patient ID: Elizabeth Dominguez, female    DOB: 11-Feb-1961  Age: 54 y.o. MRN: [id number]  CC: Foot Injury   HPI KYNZIE POLGAR presents  with pain in her lateral right foot for  6 weeks. She goes up and down a ladder at work frequently. She stepped down off a ladder and twisted her ankle and had an inversion injury of foot. She denies any treatment previously. She has persistent pain in the lateral foot worse with standing and ambulation. She has no swelling or bruising.  History Lucille has a past medical history of Cancer ([place]); Complication of anesthesia; Anxiety; Gallstones; Low blood sugar; and Fainting episodes.   She has past surgical history that includes Nasal septum surgery (1987, 1998); Abdominal hysterectomy (2008); Shoulder arthroscopy; Incontinence surgery (2010); Umbilical hernia repair (10/18/11); Hernia repair (10/2011); Cholecystectomy (01/15/13); and Cholecystectomy (N/A, 01/15/2013).   Her  family history includes Breast cancer (age of onset: 25) in her mother; Diabetes in her sister; Heart disease in her maternal grandfather; Hypertension in her mother; Parkinson's disease in her father; Skin cancer in her father.  She   reports that she has never smoked. She has never used smokeless tobacco. She reports that she drinks alcohol. She reports that she does not use illicit drugs.  Outpatient Prescriptions Prior to Visit  Medication Sig Dispense Refill  . acetaminophen (TYLENOL) 500 MG tablet Take 1,000 mg by mouth every 6 (six) hours as needed for pain.    . calcium carbonate (TUMS - DOSED IN MG ELEMENTAL CALCIUM) 500 MG chewable tablet Chew 3 tablets by mouth once.    . Nasal Dilators (BREATHE RIGHT EXTRA) STRP 1 each by Does not apply route at bedtime.    . pseudoephedrine (SUDAFED) 60 MG tablet Take 60 mg by mouth every 4 (four) hours as needed for congestion.    . famotidine (PEPCID) 20 MG tablet Take 1 tablet (20 mg total) by mouth 2 (two) times daily.  (Patient not taking: Reported on 07/27/2015) 30 tablet 0   No facility-administered medications prior to visit.    Social History   Social History  . Marital Status: Married    Spouse Name: N/A  . Number of Children: N/A  . Years of Education: N/A   Social History Main Topics  . Smoking status: Never Smoker   . Smokeless tobacco: Never Used  . Alcohol Use: Yes     Comment: occ  . Drug Use: No  . Sexual Activity: Not Asked   Other Topics Concern  . None   Social History Narrative     Review of Systems  Constitutional: Negative for fever, chills and appetite change.  HENT: Negative for congestion, ear pain, postnasal drip, sinus pressure and sore throat.   Eyes: Negative for pain and redness.  Respiratory: Negative for cough, shortness of breath and wheezing.   Cardiovascular: Negative for leg swelling.  Gastrointestinal: Negative for nausea, vomiting, abdominal pain, diarrhea, constipation and blood in stool.  Endocrine: Negative for polyuria.  Genitourinary: Negative for dysuria, urgency, frequency and flank pain.  Musculoskeletal: Negative for gait problem.  Skin: Negative for rash.  Neurological: Negative for weakness and headaches.  Psychiatric/Behavioral: Negative for confusion and decreased concentration. The patient is not nervous/anxious.     Objective:  BP 122/78 mmHg  Pulse 85  Temp(Src) 97.7 F (36.5 C) (Oral)  Resp 20  Ht 5\' 9"  (1.753 m)  Wt 197 lb 12.8 oz (89.721 kg)  BMI 29.20 kg/m2  SpO2 98%  Physical Exam  Constitutional: She is oriented to person, place, and time. She appears well-developed and well-nourished.  HENT:  Head: Normocephalic and atraumatic.  Eyes: Conjunctivae are normal. Pupils are equal, round, and reactive to light.  Pulmonary/Chest: Effort normal.  Musculoskeletal: She exhibits no edema.       Right foot: There is tenderness and bony tenderness. There is normal range of motion, no swelling and no deformity.  Neurological:  She is alert and oriented to person, place, and time.  Skin: Skin is dry.  Psychiatric: She has a normal mood and affect. Her behavior is normal. Thought content normal.      Assessment & Plan:   Stevana was seen today for foot injury.  Diagnoses and all orders for this visit:  Foot pain, right -     DG Foot Complete Right; Future -     Ambulatory referral to Podiatry  Other orders -     HYDROcodone-acetaminophen (NORCO) 5-325 MG tablet; Take 1-2 tablets by mouth every 4 (four) hours as needed. -     celecoxib (CELEBREX) 200 MG capsule; Take 1 capsule (200 mg total) by mouth daily.   I am having Ms. Brymer start on HYDROcodone-acetaminophen and celecoxib. I am also having her maintain her acetaminophen, pseudoephedrine, calcium carbonate, BREATHE RIGHT EXTRA, and famotidine.  Meds ordered this encounter  Medications  . HYDROcodone-acetaminophen (NORCO) 5-325 MG tablet    Sig: Take 1-2 tablets by mouth every 4 (four) hours as needed.    Dispense:  30 tablet    Refill:  0  . celecoxib (CELEBREX) 200 MG capsule    Sig: Take 1 capsule (200 mg total) by mouth daily.    Dispense:  30 capsule    Refill:  0    Appropriate red flag conditions were discussed with the patient as well as actions that should be taken.  Patient expressed his understanding.  Follow-up: Return if symptoms worsen or fail to improve.  Roselee Culver, MD   [facility] reading (PRIMARY) by  Dr. Ouida Sills.  Negative foot .

## 2016-11-23 ENCOUNTER — Other Ambulatory Visit (INDEPENDENT_AMBULATORY_CARE_PROVIDER_SITE_OTHER): Payer: BLUE CROSS/BLUE SHIELD

## 2016-11-23 ENCOUNTER — Encounter (INDEPENDENT_AMBULATORY_CARE_PROVIDER_SITE_OTHER): Payer: Self-pay

## 2016-11-23 ENCOUNTER — Ambulatory Visit (INDEPENDENT_AMBULATORY_CARE_PROVIDER_SITE_OTHER): Payer: BLUE CROSS/BLUE SHIELD | Admitting: Physician Assistant

## 2016-11-23 ENCOUNTER — Encounter: Payer: Self-pay | Admitting: Physician Assistant

## 2016-11-23 VITALS — BP 122/80 | HR 71 | Ht 69.0 in | Wt 205.0 lb

## 2016-11-23 DIAGNOSIS — R12 Heartburn: Secondary | ICD-10-CM | POA: Diagnosis not present

## 2016-11-23 DIAGNOSIS — R630 Anorexia: Secondary | ICD-10-CM | POA: Diagnosis not present

## 2016-11-23 DIAGNOSIS — R1013 Epigastric pain: Secondary | ICD-10-CM

## 2016-11-23 LAB — CBC WITH DIFFERENTIAL/PLATELET
BASOS ABS: 0 10*3/uL (ref 0.0–0.1)
Basophils Relative: 0.5 % (ref 0.0–3.0)
Eosinophils Absolute: 0.6 10*3/uL (ref 0.0–0.7)
Eosinophils Relative: 6.4 % — ABNORMAL HIGH (ref 0.0–5.0)
HCT: 39.6 % (ref 36.0–46.0)
Hemoglobin: 13.6 g/dL (ref 12.0–15.0)
Lymphocytes Relative: 20.7 % (ref 12.0–46.0)
Lymphs Abs: 1.9 10*3/uL (ref 0.7–4.0)
MCHC: 34.4 g/dL (ref 30.0–36.0)
MCV: 94 fl (ref 78.0–100.0)
MONOS PCT: 5.8 % (ref 3.0–12.0)
Monocytes Absolute: 0.5 10*3/uL (ref 0.1–1.0)
Neutro Abs: 6 10*3/uL (ref 1.4–7.7)
Neutrophils Relative %: 66.6 % (ref 43.0–77.0)
Platelets: 212 10*3/uL (ref 150.0–400.0)
RBC: 4.22 Mil/uL (ref 3.87–5.11)
RDW: 12.9 % (ref 11.5–15.5)
WBC: 9 10*3/uL (ref 4.0–10.5)

## 2016-11-23 LAB — COMPREHENSIVE METABOLIC PANEL
ALT: 12 U/L (ref 0–35)
AST: 18 U/L (ref 0–37)
Albumin: 4.4 g/dL (ref 3.5–5.2)
Alkaline Phosphatase: 106 U/L (ref 39–117)
BILIRUBIN TOTAL: 0.6 mg/dL (ref 0.2–1.2)
BUN: 14 mg/dL (ref 6–23)
CO2: 29 meq/L (ref 19–32)
Calcium: 9.3 mg/dL (ref 8.4–10.5)
Chloride: 104 mEq/L (ref 96–112)
Creatinine, Ser: 0.93 mg/dL (ref 0.40–1.20)
GFR: 66.48 mL/min (ref >60.00)
GLUCOSE: 102 mg/dL — AB (ref 70–99)
POTASSIUM: 3.8 meq/L (ref 3.5–5.1)
Sodium: 138 mEq/L (ref 135–145)
Total Protein: 6.8 g/dL (ref 6.0–8.3)

## 2016-11-23 LAB — LIPASE: Lipase: 31 U/L (ref 11.0–59.0)

## 2016-11-23 MED ORDER — AMBULATORY NON FORMULARY MEDICATION: 0 refills | Status: DC

## 2016-11-23 MED ORDER — OMEPRAZOLE 40 MG PO CPDR: 40.0000 mg | DELAYED_RELEASE_CAPSULE | Freq: Every day | ORAL | 6 refills | Status: DC

## 2016-11-23 NOTE — Patient Instructions (Signed)
We have sent the following medications to your pharmacy for you to pick up at your convenience:  Omeprazole  Your physician has requested that you go to the basement for the following lab work before leaving today:  CBC, CMET, Lipase  You have been scheduled for an endoscopy. Please follow written instructions given to you at your visit today. If you use inhalers (even only as needed), please bring them with you on the day of your procedure. Your physician has requested that you go to www.[email or web address].[email or web address] and enter the access code given to you at your visit today. This web site gives a general overview about your procedure. However, you should still follow specific instructions given to you by our office regarding your preparation for the procedure.

## 2016-11-23 NOTE — Progress Notes (Signed)
Chief Complaint: Epigastric abdominal pain, nausea, heartburn  HPI:  Elizabeth Dominguez is a 56 year old Caucasian female with a past medical history of panic attacks, gallstones status post cholecystectomy in April 2014 and reflux, who presents to clinic today with a complaint of severe epigastric abdominal pain, nausea and heartburn.   The patient was followed by Dr. Olevia Perches in the past.   Today, the patient tells me that she was diagnosed with reflux years ago and was always controlled on H2 blocker until September of last year when one of her dietitian friends told her to try apple cider vinegar instead. She did and is taking 2 tablespoons a day and tells me that she has not had to use any of her antacid medication since starting this. Then, 2-1/2 weeks ago the patient had an episode where she woke up from her sleep and had diarrhea and felt very tired and had a headache, she stayed off of work for 2 days but then felt better but did not regain her appetite.     She then describes 2 severe episodes of epigastric pain which woke her from her sleep this week, one on Tuesday morning around 2 AM, she woke up and felt an "11/10" epigastric pain and went to the bathroom because this made her nauseous, she ended up laying on the floor with her head on the towel in a cold sweat, her shirt was wet and her vision was blurred and she was very dizzy. She tells me that she "felt like I was going to die", this passed after about an hour with no relief from Tums at that time. This occurred again on Thursday morning. Patient tells me she has felt like a "campfire" is in my stomach for the past 4 days. The last 4-5 days she tells me she has been living off of oyster crackers and if she doesn't eat it makes her abdominal pain "worse".She has been also abiding by a bland diet including toast and bananas.  The patient tells me that she has remained very bloated and tried to take a Gas-X which was of no help, she has also lost  about 5-1/2 pounds in the past 4 days. Her bowel habits have returned to normal, which is slightly constipated.   Social history is positive for losing her mother last May and gaining around 20 pounds over the past year or so.   Patient denies fever, blood in her stool, melena, vomiting or dysphagia.   Past Medical History:  Diagnosis Date  . Anxiety    PANIC ATTACKS  . Cancer ([place])    skin  . Complication of anesthesia    PT STATES SHE GETS ANXIOUS BEFORE SURGERIES AND HAS NAUSEA BEFORE SURGERY.  . Fainting episodes    YRS AGO --NO CAUSE FOUND- POSS SEIZURES--TOOK TOPAMAX FOR A WHILE--BUT OFF THE MEDICINE NOW FOR 7 YEARS AND NO PROBLEM SINCE  . Gallstones    ABD PAIN, AND NAUSEA  . Low blood sugar    USUALLY    Past Surgical History:  Procedure Laterality Date  . ABDOMINAL HYSTERECTOMY  2008  . CHOLECYSTECTOMY  01/15/13  . CHOLECYSTECTOMY N/A 01/15/2013   Procedure: LAPAROSCOPIC CHOLECYSTECTOMY WITH INTRAOPERATIVE CHOLANGIOGRAM;  Surgeon: Stark Klein, MD;  Location: [facility];  Service: General;  Laterality: N/A;  . HERNIA REPAIR  10/2011   umb hernia repair  . INCONTINENCE SURGERY  2010   BLADDER SLING  . [place]  . SHOULDER ARTHROSCOPY  left  . UMBILICAL HERNIA REPAIR  10/18/11    Current Outpatient Prescriptions  Medication Sig Dispense Refill  . DULoxetine (CYMBALTA) 30 MG capsule Take 30 mg by mouth daily.    . Nasal Dilators (BREATHE RIGHT EXTRA) STRP 1 each by Does not apply route at bedtime.    . pseudoephedrine (SUDAFED) 60 MG tablet Take 60 mg by mouth every 4 (four) hours as needed for congestion.    . AMBULATORY NON FORMULARY MEDICATION Medication Name: GI cocktail:  400mg  Maalox, 10mg  Dicyclomine, 2% lidocaine  Take 5/10 lm every 4-6 hours as needed 1 Bottle 0  . omeprazole (PRILOSEC) 40 MG capsule Take 1 capsule (40 mg total) by mouth daily. 30 capsule 6   No current facility-administered medications for this visit.     Allergies  as of 11/23/2016 - Review Complete 11/23/2016  Allergen Reaction Noted  . Dilaudid [hydromorphone hcl] Itching and Rash 01/15/2013    Family History  Problem Relation Age of Onset  . Hypertension Mother   . Breast cancer Mother 44  . Skin cancer Father   . Parkinson's disease Father   . Diabetes Sister   . Heart disease Maternal Grandfather     Social History   Social History  . Marital status: Married    Spouse name: N/A  . Number of children: N/A  . Years of education: N/A   Occupational History  . Not on file.   Social History Main Topics  . Smoking status: Never Smoker  . Smokeless tobacco: Never Used  . Alcohol use Yes     Comment: occ  . Drug use: No  . Sexual activity: Not on file   Other Topics Concern  . Not on file   Social History Narrative  . No narrative on file    Review of Systems:    Constitutional: Positive for fatigue and sweats No weight loss, fever, chills, weakness  Skin: No rash  Cardiovascular: No chest pain  Respiratory: No SOB Gastrointestinal: See HPI and otherwise negative Genitourinary: No dysuria or change in urinary frequency Neurological:See HPI Musculoskeletal: No new muscle or joint pain Hematologic: No bleeding  Psychiatric: Positive for anxiety   Physical Exam:  Vital signs: BP 122/80   Pulse 71   Ht 5\' 9"  (1.753 m)   Wt 205 lb (93 kg)   SpO2 99%   BMI 30.27 kg/m   Constitutional:   Pleasant Caucasian female appears to be in NAD, Well developed, Well nourished, alert and cooperative Head:  Normocephalic and atraumatic. Eyes:   PEERL, EOMI. No icterus. Conjunctiva pink. Ears:  Normal auditory acuity. Neck:  Supple Throat: Oral cavity and pharynx without inflammation, swelling or lesion.  Respiratory: Respirations even and unlabored. Lungs clear to auscultation bilaterally.   No wheezes, crackles, or rhonchi.  Cardiovascular: Normal S1, S2. No MRG. Regular rate and rhythm. No peripheral edema, cyanosis or pallor.    Gastrointestinal:  Soft, nondistended, moderate epigastric TTP No rebound or guarding. Normal bowel sounds. No appreciable masses or hepatomegaly. Rectal:  Not performed.  Msk:  Symmetrical without gross deformities. Without edema, no deformity or joint abnormality.  Neurologic:  Alert and  oriented x4;  grossly normal neurologically.  Skin:   Dry and intact without significant lesions or rashes. Psychiatric:  Demonstrates good judgement and reason without abnormal affect or behaviors.  No recent labs or imaging.  Assessment: 1. Epigastric abdominal pain: 2 severe episodes of 11/10 epigastric pain which has left the patient on the bathroom floor in  a cold sweat, dizzy and nauseated, typically resolves after about an hour, pain seems somewhat better after eating; consider duodenal ulcer versus gastric ulcer versus severe gastritis versus other, question underlying bile acid reflux after cholecystectomy as this is when patient's reflux symptoms. 2. Heartburn: Worse over the past 4 days, had previously been relieved with apple cider vinegar since September 3. Decreased appetite: For the past 2-1/2 weeks, consider relation to above  Plan: 1. Scheduled patient for an EGD and LEC with Dr. Ardis Hughs, as he had a sooner appointment time (discussed with him at time of patient's visit). Discussed risks, benefits, limitations and alternatives the patient agrees to proceed. 2. Prescribed Omeprazole 40 mg daily, 30-60 minutes before dinner 3. Provided the patient with GI cocktail, she can take 5-10 mL's every 4-6 hours as needed for acute epigastric abdominal pain 4. Recommend patient abide by an antireflux diet and lifestyle modifications 5. Ordered labs to include a CBC, CMP and lipase 6. Patient to follow in clinic per recommendations from Dr. Ardis Hughs after time of procedure.  Ellouise Newer, PA-C [facility] 11/23/2016, 11:39 AM

## 2016-11-23 NOTE — Progress Notes (Signed)
I agree with the above note, plan 

## 2016-11-26 ENCOUNTER — Encounter: Payer: Self-pay | Admitting: Gastroenterology

## 2016-11-26 ENCOUNTER — Ambulatory Visit (AMBULATORY_SURGERY_CENTER): Payer: BLUE CROSS/BLUE SHIELD | Admitting: Gastroenterology

## 2016-11-26 VITALS — BP 118/61 | HR 64 | Temp 97.1°F | Resp 13 | Wt 205.0 lb

## 2016-11-26 DIAGNOSIS — R1013 Epigastric pain: Secondary | ICD-10-CM

## 2016-11-26 DIAGNOSIS — K2970 Gastritis, unspecified, without bleeding: Secondary | ICD-10-CM | POA: Diagnosis not present

## 2016-11-26 DIAGNOSIS — K2990 Gastroduodenitis, unspecified, without bleeding: Secondary | ICD-10-CM | POA: Diagnosis not present

## 2016-11-26 DIAGNOSIS — K2950 Unspecified chronic gastritis without bleeding: Secondary | ICD-10-CM | POA: Diagnosis not present

## 2016-11-26 MED ORDER — SODIUM CHLORIDE 0.9 % IV SOLN: 500.0000 mL | INTRAVENOUS | Status: DC

## 2016-11-26 NOTE — Patient Instructions (Signed)
YOU HAD AN ENDOSCOPIC PROCEDURE TODAY AT THE [facility]:   Refer to the procedure report that was given to you for any specific questions about what was found during the examination.  If the procedure report does not answer your questions, please call your gastroenterologist to clarify.  If you requested that your care partner not be given the details of your procedure findings, then the procedure report has been included in a sealed envelope for you to review at your convenience later.  YOU SHOULD EXPECT: Some feelings of bloating in the abdomen. Passage of more gas than usual.  Walking can help get rid of the air that was put into your GI tract during the procedure and reduce the bloating. If you had a lower endoscopy (such as a colonoscopy or flexible sigmoidoscopy) you may notice spotting of blood in your stool or on the toilet paper. If you underwent a bowel prep for your procedure, you may not have a normal bowel movement for a few days.  Please Note:  You might notice some irritation and congestion in your nose or some drainage.  This is from the oxygen used during your procedure.  There is no need for concern and it should clear up in a day or so.  SYMPTOMS TO REPORT IMMEDIATELY:    Following upper endoscopy (EGD)  Vomiting of blood or coffee ground material  New chest pain or pain under the shoulder blades  Painful or persistently difficult swallowing  New shortness of breath  Fever of 100F or higher  Black, tarry-looking stools  For urgent or emergent issues, a gastroenterologist can be reached at any hour by calling [phone].   DIET:  We do recommend a small meal at first, but then you may proceed to your regular diet.  Drink plenty of fluids but you should avoid alcoholic beverages for 24 hours.  MEDICATIONS: Continue present medications.  ACTIVITY:  You should plan to take it easy for the rest of today and you should NOT DRIVE or use heavy machinery until  tomorrow (because of the sedation medicines used during the test).    FOLLOW UP: Our staff will call the number listed on your records the next business day following your procedure to check on you and address any questions or concerns that you may have regarding the information given to you following your procedure. If we do not reach you, we will leave a message.  However, if you are feeling well and you are not experiencing any problems, there is no need to return our call.  We will assume that you have returned to your regular daily activities without incident.  If any biopsies were taken you will be contacted by phone or by letter within the next 1-3 weeks.  Please call us at [phone] if you have not heard about the biopsies in 3 weeks.   Thank you for allowing us to provide for your healthcare needs today.   SIGNATURES/CONFIDENTIALITY: You and/or your care partner have signed paperwork which will be entered into your electronic medical record.  These signatures attest to the fact that that the information above on your After Visit Summary has been reviewed and is understood.  Full responsibility of the confidentiality of this discharge information lies with you and/or your care-partner. 

## 2016-11-26 NOTE — Progress Notes (Signed)
Called to room to assist during endoscopic procedure.  Patient ID and intended procedure confirmed with present staff. Received instructions for my participation in the procedure from the performing physician.  

## 2016-11-26 NOTE — Progress Notes (Signed)
Report given to PACU, vss 

## 2016-11-26 NOTE — Op Note (Signed)
[place] Patient Name: Elizabeth Dominguez Procedure Date: 11/26/2016 9:07 AM MRN: [id number] Endoscopist: Milus Banister , MD Age: 56 Referring MD:  Date of Birth: 08-20-1961 Gender: Female Account #: [id number] Procedure:                Upper GI endoscopy Indications:              Epigastric abdominal pain Medicines:                Monitored Anesthesia Care Procedure:                Pre-Anesthesia Assessment:                           - Prior to the procedure, a History and Physical                            was performed, and patient medications and                            allergies were reviewed. The patient's tolerance of                            previous anesthesia was also reviewed. The risks                            and benefits of the procedure and the sedation                            options and risks were discussed with the patient.                            All questions were answered, and informed consent                            was obtained. Prior Anticoagulants: The patient has                            taken no previous anticoagulant or antiplatelet                            agents. ASA Grade Assessment: II - A patient with                            mild systemic disease. After reviewing the risks                            and benefits, the patient was deemed in                            satisfactory condition to undergo the procedure.                           After obtaining informed consent, the endoscope was  passed under direct vision. Throughout the                            procedure, the patient's blood pressure, pulse, and                            oxygen saturations were monitored continuously. The                            Model GIF-HQ190 [id number]) scope was introduced                            through the mouth, and advanced to the second part                            of duodenum. The upper GI  endoscopy was                            accomplished without difficulty. The patient                            tolerated the procedure well. Scope In: Scope Out: Findings:                 The esophagus was normal.                           Mild inflammation characterized by erythema and                            granularity was found in the gastric antrum.                            Biopsies were taken with a cold forceps for                            histology.                           The examined duodenum was normal. Complications:            No immediate complications. Estimated blood loss:                            None. Estimated Blood Loss:     Estimated blood loss: none. Impression:               - Normal esophagus.                           - Gastritis. Biopsied.                           - Normal examined duodenum. Recommendation:           - Patient has a contact number available for  emergencies. The signs and symptoms of potential                            delayed complications were discussed with the                            patient. Return to normal activities tomorrow.                            Written discharge instructions were provided to the                            patient.                           - Resume previous diet.                           - Continue present medications.                           - Await pathology results. If these biopsies show                            H. pylori infection you will be treated with                            appropriate antibiotics. If not, then will likely                            recommend further testing for your intermittent                            epigastric pain (MRI with MRCP to check for                            retained/recurrent bile duct stones). Milus Banister, MD 11/26/2016 9:27:16 AM This report has been signed electronically.

## 2016-11-27 ENCOUNTER — Telehealth: Payer: Self-pay | Admitting: *Deleted

## 2016-11-27 NOTE — Telephone Encounter (Signed)
  Follow up Call-  Call back number 11/26/2016  Post procedure Call Back phone  # [phone]  Permission to leave phone message Yes  Some recent data might be hidden     Patient questions:  Do you have a fever, pain , or abdominal swelling? No. Pain Score  0 *  Have you tolerated food without any problems? Yes.    Have you been able to return to your normal activities? Yes.    Do you have any questions about your discharge instructions: Diet   No. Medications  No. Follow up visit  No.  Do you have questions or concerns about your Care? Yes.    Actions: * If pain score is 4 or above: No action needed, pain <4.

## 2017-03-04 ENCOUNTER — Encounter: Payer: Self-pay | Admitting: Gastroenterology

## 2017-04-19 ENCOUNTER — Ambulatory Visit (AMBULATORY_SURGERY_CENTER): Payer: Self-pay | Admitting: *Deleted

## 2017-04-19 VITALS — Ht 69.0 in | Wt 204.8 lb

## 2017-04-19 DIAGNOSIS — Z1211 Encounter for screening for malignant neoplasm of colon: Secondary | ICD-10-CM

## 2017-04-19 MED ORDER — NA SULFATE-K SULFATE-MG SULF 17.5-3.13-1.6 GM/177ML PO SOLN: 1.0000 | Freq: Once | ORAL | 0 refills | Status: AC

## 2017-04-19 NOTE — Progress Notes (Signed)
Denies allergies to eggs or soy products. Denies complications with sedation or anesthesia. Denies O2 use. Denies use of diet or weight loss medications.  Emmi instructions given for colonoscopy.  

## 2017-04-22 ENCOUNTER — Encounter: Payer: Self-pay | Admitting: Gastroenterology

## 2017-05-03 ENCOUNTER — Ambulatory Visit (AMBULATORY_SURGERY_CENTER): Payer: BLUE CROSS/BLUE SHIELD | Admitting: Gastroenterology

## 2017-05-03 ENCOUNTER — Encounter: Payer: Self-pay | Admitting: Gastroenterology

## 2017-05-03 VITALS — BP 113/60 | HR 60 | Temp 97.8°F | Resp 15 | Ht 69.0 in | Wt 205.0 lb

## 2017-05-03 DIAGNOSIS — Z1211 Encounter for screening for malignant neoplasm of colon: Secondary | ICD-10-CM | POA: Diagnosis present

## 2017-05-03 DIAGNOSIS — Z1212 Encounter for screening for malignant neoplasm of rectum: Secondary | ICD-10-CM

## 2017-05-03 MED ORDER — SODIUM CHLORIDE 0.9 % IV SOLN: 500.0000 mL | INTRAVENOUS | Status: AC

## 2017-05-03 NOTE — Progress Notes (Signed)
To recovery, report to McCoy, RN, VSS 

## 2017-05-03 NOTE — Progress Notes (Signed)
Pt's states no medical or surgical changes since previsit or office visit. 

## 2017-05-03 NOTE — Op Note (Signed)
[name] Procedure Date: 05/03/2017 10:27 AM MRN: [id number] Endoscopist: Milus Banister , MD Age: 56 Referring MD:  Date of Birth: 1961/06/06 Gender: Female Account #: [id number] Procedure:                Colonoscopy Indications:              Screening for colorectal malignant neoplasm Medicines:                Monitored Anesthesia Care Procedure:                Pre-Anesthesia Assessment:                           - Prior to the procedure, a History and Physical                            was performed, and patient medications and                            allergies were reviewed. The patient's tolerance of                            previous anesthesia was also reviewed. The risks                            and benefits of the procedure and the sedation                            options and risks were discussed with the patient.                            All questions were answered, and informed consent                            was obtained. Prior Anticoagulants: The patient has                            taken no previous anticoagulant or antiplatelet                            agents. ASA Grade Assessment: II - A patient with                            mild systemic disease. After reviewing the risks                            and benefits, the patient was deemed in                            satisfactory condition to undergo the procedure.                           After obtaining informed consent, the colonoscope  was passed under direct vision. Throughout the                            procedure, the patient's blood pressure, pulse, and                            oxygen saturations were monitored continuously. The                            Colonoscope was introduced through the anus and                            advanced to the the cecum, identified by                            appendiceal orifice and  ileocecal valve. The                            colonoscopy was performed without difficulty. The                            patient tolerated the procedure well. The quality                            of the bowel preparation was good. The ileocecal                            valve, appendiceal orifice, and rectum were                            photographed. Scope In: 10:37:19 AM Scope Out: 10:53:40 AM Scope Withdrawal Time: 0 hours 10 minutes 58 seconds  Total Procedure Duration: 0 hours 16 minutes 21 seconds  Findings:                 The entire examined colon appeared normal on direct                            and retroflexion views. Complications:            No immediate complications. Estimated blood loss:                            None. Estimated Blood Loss:     Estimated blood loss: none. Impression:               - The entire examined colon is normal on direct and                            retroflexion views.                           - No polyps or cancers Recommendation:           - Patient has a contact number available for  emergencies. The signs and symptoms of potential                            delayed complications were discussed with the                            patient. Return to normal activities tomorrow.                            Written discharge instructions were provided to the                            patient.                           - Resume previous diet.                           - Continue present medications.                           - Repeat colonoscopy in 10 years for screening. Milus Banister, MD 05/03/2017 10:58:43 AM This report has been signed electronically.

## 2017-05-03 NOTE — Patient Instructions (Signed)
Discharge instructions given. Normal exam. Resume previous medications. YOU HAD AN ENDOSCOPIC PROCEDURE TODAY AT THE [facility]:   Refer to the procedure report that was given to you for any specific questions about what was found during the examination.  If the procedure report does not answer your questions, please call your gastroenterologist to clarify.  If you requested that your care partner not be given the details of your procedure findings, then the procedure report has been included in a sealed envelope for you to review at your convenience later.  YOU SHOULD EXPECT: Some feelings of bloating in the abdomen. Passage of more gas than usual.  Walking can help get rid of the air that was put into your GI tract during the procedure and reduce the bloating. If you had a lower endoscopy (such as a colonoscopy or flexible sigmoidoscopy) you may notice spotting of blood in your stool or on the toilet paper. If you underwent a bowel prep for your procedure, you may not have a normal bowel movement for a few days.  Please Note:  You might notice some irritation and congestion in your nose or some drainage.  This is from the oxygen used during your procedure.  There is no need for concern and it should clear up in a day or so.  SYMPTOMS TO REPORT IMMEDIATELY:   Following lower endoscopy (colonoscopy or flexible sigmoidoscopy):  Excessive amounts of blood in the stool  Significant tenderness or worsening of abdominal pains  Swelling of the abdomen that is new, acute  Fever of 100F or higher   For urgent or emergent issues, a gastroenterologist can be reached at any hour by calling [phone].   DIET:  We do recommend a small meal at first, but then you may proceed to your regular diet.  Drink plenty of fluids but you should avoid alcoholic beverages for 24 hours.  ACTIVITY:  You should plan to take it easy for the rest of today and you should NOT DRIVE or use heavy machinery  until tomorrow (because of the sedation medicines used during the test).    FOLLOW UP: Our staff will call the number listed on your records the next business day following your procedure to check on you and address any questions or concerns that you may have regarding the information given to you following your procedure. If we do not reach you, we will leave a message.  However, if you are feeling well and you are not experiencing any problems, there is no need to return our call.  We will assume that you have returned to your regular daily activities without incident.  If any biopsies were taken you will be contacted by phone or by letter within the next 1-3 weeks.  Please call us at [phone] if you have not heard about the biopsies in 3 weeks.    SIGNATURES/CONFIDENTIALITY: You and/or your care partner have signed paperwork which will be entered into your electronic medical record.  These signatures attest to the fact that that the information above on your After Visit Summary has been reviewed and is understood.  Full responsibility of the confidentiality of this discharge information lies with you and/or your care-partner. 

## 2017-05-06 ENCOUNTER — Telehealth: Payer: Self-pay | Admitting: *Deleted

## 2017-05-06 NOTE — Telephone Encounter (Signed)
  Follow up Call-  Call back number 05/03/2017 11/26/2016  Post procedure Call Back phone  # [phone]  Permission to leave phone message Yes Yes  Some recent data might be hidden     Patient questions:  Do you have a fever, pain , or abdominal swelling? No. Pain Score  0 *  Have you tolerated food without any problems? Yes.    Have you been able to return to your normal activities? Yes.    Do you have any questions about your discharge instructions: Diet   No. Medications  No. Follow up visit  No.  Do you have questions or concerns about your Care? No.  Actions: * If pain score is 4 or above: No action needed, pain <4.

## 2018-05-21 ENCOUNTER — Emergency Department (HOSPITAL_COMMUNITY): Payer: BLUE CROSS/BLUE SHIELD

## 2018-05-21 ENCOUNTER — Encounter (HOSPITAL_COMMUNITY): Payer: Self-pay | Admitting: Emergency Medicine

## 2018-05-21 ENCOUNTER — Observation Stay (HOSPITAL_COMMUNITY)
Admission: EM | Admit: 2018-05-21 | Discharge: 2018-05-22 | Disposition: A | Discharge status: Home / Self Care | Payer: BLUE CROSS/BLUE SHIELD | Attending: General Surgery | Admitting: General Surgery

## 2018-05-21 ENCOUNTER — Other Ambulatory Visit: Payer: Self-pay

## 2018-05-21 ENCOUNTER — Observation Stay (HOSPITAL_COMMUNITY): Payer: BLUE CROSS/BLUE SHIELD

## 2018-05-21 DIAGNOSIS — X501XXA Overexertion from prolonged static or awkward postures, initial encounter: Secondary | ICD-10-CM | POA: Insufficient documentation

## 2018-05-21 DIAGNOSIS — R635 Abnormal weight gain: Secondary | ICD-10-CM | POA: Insufficient documentation

## 2018-05-21 DIAGNOSIS — R600 Localized edema: Secondary | ICD-10-CM | POA: Insufficient documentation

## 2018-05-21 DIAGNOSIS — R188 Other ascites: Secondary | ICD-10-CM | POA: Diagnosis not present

## 2018-05-21 DIAGNOSIS — J302 Other seasonal allergic rhinitis: Secondary | ICD-10-CM | POA: Insufficient documentation

## 2018-05-21 DIAGNOSIS — R222 Localized swelling, mass and lump, trunk: Secondary | ICD-10-CM

## 2018-05-21 DIAGNOSIS — Z90710 Acquired absence of both cervix and uterus: Secondary | ICD-10-CM | POA: Diagnosis not present

## 2018-05-21 DIAGNOSIS — S29011A Strain of muscle and tendon of front wall of thorax, initial encounter: Principal | ICD-10-CM | POA: Insufficient documentation

## 2018-05-21 DIAGNOSIS — F329 Major depressive disorder, single episode, unspecified: Secondary | ICD-10-CM | POA: Insufficient documentation

## 2018-05-21 DIAGNOSIS — R55 Syncope and collapse: Secondary | ICD-10-CM

## 2018-05-21 DIAGNOSIS — K689 Other disorders of retroperitoneum: Secondary | ICD-10-CM | POA: Diagnosis not present

## 2018-05-21 DIAGNOSIS — F410 Panic disorder [episodic paroxysmal anxiety] without agoraphobia: Secondary | ICD-10-CM | POA: Insufficient documentation

## 2018-05-21 DIAGNOSIS — R140 Abdominal distension (gaseous): Secondary | ICD-10-CM | POA: Insufficient documentation

## 2018-05-21 DIAGNOSIS — R6881 Early satiety: Secondary | ICD-10-CM | POA: Diagnosis not present

## 2018-05-21 DIAGNOSIS — Z79899 Other long term (current) drug therapy: Secondary | ICD-10-CM | POA: Diagnosis not present

## 2018-05-21 DIAGNOSIS — R739 Hyperglycemia, unspecified: Secondary | ICD-10-CM | POA: Diagnosis not present

## 2018-05-21 DIAGNOSIS — R58 Hemorrhage, not elsewhere classified: Secondary | ICD-10-CM

## 2018-05-21 HISTORY — DX: Nausea with vomiting, unspecified: R11.2

## 2018-05-21 HISTORY — DX: Strain of muscle and tendon of front wall of thorax, initial encounter: S29.011A

## 2018-05-21 HISTORY — DX: Other specified postprocedural states: Z98.890

## 2018-05-21 HISTORY — DX: Personal history of urinary calculi: Z87.442

## 2018-05-21 LAB — TYPE AND SCREEN
ABO/RH(D): O POS
Antibody Screen: NEGATIVE

## 2018-05-21 LAB — CBC
HEMATOCRIT: 40.7 % (ref 36.0–46.0)
HEMATOCRIT: 42.4 % (ref 36.0–46.0)
HEMOGLOBIN: 13.9 g/dL (ref 12.0–15.0)
HEMOGLOBIN: 14.2 g/dL (ref 12.0–15.0)
MCH: 32.5 pg (ref 26.0–34.0)
MCH: 32.9 pg (ref 26.0–34.0)
MCHC: 33.5 g/dL (ref 30.0–36.0)
MCHC: 34.2 g/dL (ref 30.0–36.0)
MCV: 96.2 fL (ref 78.0–100.0)
MCV: 97 fL (ref 78.0–100.0)
Platelets: 207 10*3/uL (ref 150–400)
Platelets: 217 10*3/uL (ref 150–400)
RBC: 4.23 MIL/uL (ref 3.87–5.11)
RBC: 4.37 MIL/uL (ref 3.87–5.11)
RDW: 12.3 % (ref 11.5–15.5)
RDW: 12.4 % (ref 11.5–15.5)
WBC: 8.7 10*3/uL (ref 4.0–10.5)
WBC: 7.3 10*3/uL (ref 4.0–10.5)

## 2018-05-21 LAB — URINALYSIS, ROUTINE W REFLEX MICROSCOPIC
BILIRUBIN URINE: NEGATIVE
Glucose, UA: NEGATIVE mg/dL
KETONES UR: NEGATIVE mg/dL
Leukocytes, UA: NEGATIVE
Nitrite: NEGATIVE
PH: 6 (ref 5.0–8.0)
Protein, ur: NEGATIVE mg/dL
SPECIFIC GRAVITY, URINE: 1.039 — AB (ref 1.005–1.030)

## 2018-05-21 LAB — I-STAT CHEM 8, ED
BUN: 23 mg/dL — ABNORMAL HIGH (ref 6–20)
CREATININE: 1 mg/dL (ref 0.44–1.00)
Calcium, Ion: 1.11 mmol/L — ABNORMAL LOW (ref 1.15–1.40)
Chloride: 102 mmol/L (ref 98–111)
GLUCOSE: 134 mg/dL — AB (ref 70–99)
HEMATOCRIT: 40 % (ref 36.0–46.0)
HEMOGLOBIN: 13.6 g/dL (ref 12.0–15.0)
POTASSIUM: 3.8 mmol/L (ref 3.5–5.1)
Sodium: 137 mmol/L (ref 135–145)
TCO2: 23 mmol/L (ref 22–32)

## 2018-05-21 LAB — RAPID URINE DRUG SCREEN, HOSP PERFORMED
AMPHETAMINES: NOT DETECTED
Barbiturates: NOT DETECTED
Benzodiazepines: NOT DETECTED
Cocaine: NOT DETECTED
Opiates: NOT DETECTED
Tetrahydrocannabinol: NOT DETECTED

## 2018-05-21 LAB — ABO/RH: ABO/RH(D): O POS

## 2018-05-21 LAB — CBG MONITORING, ED: GLUCOSE-CAPILLARY: 118 mg/dL — AB (ref 70–99)

## 2018-05-21 LAB — BASIC METABOLIC PANEL
Anion gap: 9 (ref 5–15)
BUN: 22 mg/dL — AB (ref 6–20)
CHLORIDE: 103 mmol/L (ref 98–111)
CO2: 26 mmol/L (ref 22–32)
Calcium: 9.1 mg/dL (ref 8.9–10.3)
Creatinine, Ser: 1.02 mg/dL — ABNORMAL HIGH (ref 0.44–1.00)
GFR calc Af Amer: >60 mL/min (ref >60)
GFR calc non Af Amer: >60 mL/min (ref >60)
GLUCOSE: 138 mg/dL — AB (ref 70–99)
POTASSIUM: 3.9 mmol/L (ref 3.5–5.1)
Sodium: 138 mmol/L (ref 135–145)

## 2018-05-21 LAB — I-STAT BETA HCG BLOOD, ED (MC, WL, AP ONLY): HCG, QUANTITATIVE: 7.6 m[IU]/mL — AB (ref <5)

## 2018-05-21 LAB — HCG, QUANTITATIVE, PREGNANCY: hCG, Beta Chain, Quant, S: 4 m[IU]/mL (ref <5)

## 2018-05-21 LAB — HEMOGLOBIN A1C
HEMOGLOBIN A1C: 5.5 % (ref 4.8–5.6)
MEAN PLASMA GLUCOSE: 111.15 mg/dL

## 2018-05-21 LAB — PROTIME-INR
INR: 1.07
PROTHROMBIN TIME: 13.8 s (ref 11.4–15.2)

## 2018-05-21 LAB — TROPONIN I

## 2018-05-21 MED ORDER — ONDANSETRON HCL 4 MG/2ML IJ SOLN
4.0000 mg | Freq: Once | INTRAMUSCULAR | Status: AC
  Administered 2018-05-21: 4 mg via INTRAVENOUS
  Filled 2018-05-21: qty 2

## 2018-05-21 MED ORDER — SODIUM CHLORIDE 0.9% FLUSH: 3.0000 mL | INTRAVENOUS | Status: DC | PRN

## 2018-05-21 MED ORDER — DULOXETINE HCL 30 MG PO CPEP
30.0000 mg | ORAL_CAPSULE | Freq: Every day | ORAL | Status: DC
  Administered 2018-05-21 – 2018-05-22 (×2): 30 mg via ORAL
  Filled 2018-05-21 (×3): qty 1

## 2018-05-21 MED ORDER — SODIUM CHLORIDE 0.9 % IV SOLN: 250.0000 mL | INTRAVENOUS | Status: DC | PRN

## 2018-05-21 MED ORDER — MAGNESIUM GLUCONATE 500 MG PO TABS
500.0000 mg | ORAL_TABLET | Freq: Every day | ORAL | Status: DC
  Administered 2018-05-21 – 2018-05-22 (×2): 500 mg via ORAL
  Filled 2018-05-21 (×3): qty 1

## 2018-05-21 MED ORDER — ACETAMINOPHEN 10 MG/ML IV SOLN
1000.0000 mg | Freq: Four times a day (QID) | INTRAVENOUS | Status: AC
  Administered 2018-05-21 (×4): 1000 mg via INTRAVENOUS
  Filled 2018-05-21 (×5): qty 100

## 2018-05-21 MED ORDER — TRAMADOL HCL 50 MG PO TABS: 50.0000 mg | ORAL_TABLET | Freq: Four times a day (QID) | ORAL | Status: DC | PRN

## 2018-05-21 MED ORDER — SODIUM CHLORIDE 0.9 % IV BOLUS
1000.0000 mL | Freq: Once | INTRAVENOUS | Status: AC
  Administered 2018-05-21: 1000 mL via INTRAVENOUS

## 2018-05-21 MED ORDER — ONDANSETRON 4 MG PO TBDP: 4.0000 mg | ORAL_TABLET | Freq: Four times a day (QID) | ORAL | Status: DC | PRN

## 2018-05-21 MED ORDER — ACETAMINOPHEN 325 MG PO TABS: 650.0000 mg | ORAL_TABLET | ORAL | Status: DC | PRN

## 2018-05-21 MED ORDER — SODIUM CHLORIDE 0.9% FLUSH: 3.0000 mL | Freq: Two times a day (BID) | INTRAVENOUS | Status: DC

## 2018-05-21 MED ORDER — IOHEXOL 300 MG/ML  SOLN
75.0000 mL | Freq: Once | INTRAMUSCULAR | Status: AC
  Administered 2018-05-21: 60 mL via INTRAVENOUS

## 2018-05-21 MED ORDER — ONDANSETRON HCL 4 MG/2ML IJ SOLN: 4.0000 mg | Freq: Four times a day (QID) | INTRAMUSCULAR | Status: DC | PRN

## 2018-05-21 MED ORDER — ADULT MULTIVITAMIN W/MINERALS CH
ORAL_TABLET | Freq: Every day | ORAL | Status: DC
  Administered 2018-05-21 – 2018-05-22 (×2): 1 via ORAL
  Filled 2018-05-21 (×5): qty 1

## 2018-05-21 MED ORDER — PSEUDOEPHEDRINE HCL 30 MG PO TABS
30.0000 mg | ORAL_TABLET | Freq: Four times a day (QID) | ORAL | Status: DC | PRN
  Filled 2018-05-21: qty 1

## 2018-05-21 MED ORDER — DOCUSATE SODIUM 100 MG PO CAPS: 100.0000 mg | ORAL_CAPSULE | Freq: Every day | ORAL | Status: DC | PRN

## 2018-05-21 MED ORDER — IOPAMIDOL (ISOVUE-370) INJECTION 76%
50.0000 mL | Freq: Once | INTRAVENOUS | Status: AC | PRN
  Administered 2018-05-21: 50 mL via INTRAVENOUS

## 2018-05-21 NOTE — ED Provider Notes (Addendum)
[facility] DEPART[facility] Provider Note   CSN: [id number] Arrival date & time: 05/21/18  0020     History   Chief Complaint Chief Complaint  Patient presents with  . Neck Pain  . Loss of Consciousness  . Shortness of Breath    HPI Elizabeth Dominguez is a 57 y.o. female.  The history is provided by the patient.  Neck Pain   This is a new problem. The current episode started less than 1 hour ago. The problem occurs constantly. The problem has not changed since onset.Associated with: bending neck forward. There has been no fever. The pain is present in the left side. The quality of the pain is described as stabbing. The pain is at a severity of 8/10. The pain is severe. The symptoms are aggravated by bending. The pain is the same all the time. Pertinent negatives include no photophobia, no chest pain, no numbness, no weight loss, no headaches, no paresis, no tingling and no weakness. She has tried nothing for the symptoms. The treatment provided no relief.  Illness  This is a new problem. The current episode started less than 1 hour ago. The problem occurs constantly. The problem has not changed since onset.Pertinent negatives include no chest pain, no abdominal pain and no headaches. Nothing aggravates the symptoms. Nothing relieves the symptoms. She has tried nothing for the symptoms. The treatment provided no relief.  Patient ate half a hamburger and fries and came home flexed neck to take off shirt felt a pop and then had swelling in supraclavicular fossa.   Tried to lay on left side to sleep and was SOB on that side.  Also had LOC with the incident.  No f/c/r. No cough, no sore throat, no changes in speech, no weakness or numbness. Mild nausea.  No exertional CP some diaphoresis.    Past Medical History:  Diagnosis Date  . Anxiety    PANIC ATTACKS  . Cancer ([place])    skin  . Complication of anesthesia    PT STATES SHE GETS ANXIOUS BEFORE SURGERIES AND HAS  NAUSEA BEFORE SURGERY.  . Fainting episodes    YRS AGO --NO CAUSE FOUND- POSS SEIZURES--TOOK TOPAMAX FOR A WHILE--BUT OFF THE MEDICINE NOW FOR 7 YEARS AND NO PROBLEM SINCE  . Gallstones    ABD PAIN, AND NAUSEA  . Kidney stones   . Low blood sugar    USUALLY  . Osteopenia   . Seasonal allergies     Patient Active Problem List   Diagnosis Date Noted  . Symptomatic cholelithiasis 01/13/2013  . Umbilical hernia, incarcerated 10/15/2011    Past Surgical History:  Procedure Laterality Date  . ABDOMINAL HYSTERECTOMY  2008  . CHOLECYSTECTOMY  01/15/13  . CHOLECYSTECTOMY N/A 01/15/2013   Procedure: LAPAROSCOPIC CHOLECYSTECTOMY WITH INTRAOPERATIVE CHOLANGIOGRAM;  Surgeon: Stark Klein, MD;  Location: [facility];  Service: General;  Laterality: N/A;  . HERNIA REPAIR  10/2011   umb hernia repair  . INCONTINENCE SURGERY  2010   BLADDER SLING  . [place]  . SHOULDER ARTHROSCOPY     left  . UMBILICAL HERNIA REPAIR  10/18/11     OB History   None      Home Medications    Prior to Admission medications   Medication Sig Start Date End Date Taking? Authorizing Provider  APPLE CIDER VINEGAR PO Take by mouth once a week.    [provider]  ASHWAGANDHA PO Take 800 mg by  mouth daily.    [provider]  Cholecalciferol (VITAMIN D3) 2000 units TABS Take by mouth.    [provider]  docusate sodium (COLACE) 100 MG capsule Take 100 mg by mouth daily as needed for mild constipation.    [provider]  DULoxetine (CYMBALTA) 30 MG capsule Take 30 mg by mouth daily.    [provider]  magnesium gluconate (MAGONATE) 500 MG tablet Take 500 mg by mouth 2 (two) times daily.    [provider]  MULTIPLE VITAMIN PO Take 1 tablet by mouth daily. Hair, skin and nail gummy    [provider]  Nasal Dilators (BREATHE RIGHT EXTRA) STRP 1 each by Does not apply route at bedtime.    [provider]  pseudoephedrine  (SUDAFED) 60 MG tablet Take 60 mg by mouth every 4 (four) hours as needed for congestion.    [provider]    Family History Family History  Problem Relation Age of Onset  . Hypertension Mother   . Breast cancer Mother 11  . Skin cancer Father   . Parkinson's disease Father   . Diabetes Sister   . Heart disease Maternal Grandfather   . Colon cancer Neg Hx   . Esophageal cancer Neg Hx   . Rectal cancer Neg Hx   . Stomach cancer Neg Hx     Social History Social History   Tobacco Use  . Smoking status: Never Smoker  . Smokeless tobacco: Never Used  Substance Use Topics  . Alcohol use: Yes    Comment: occ  . Drug use: No     Allergies   Ibuprofen and Dilaudid [hydromorphone hcl]   Review of Systems Review of Systems  Constitutional: Negative for diaphoresis, fatigue and weight loss.  HENT: Negative for congestion, ear discharge, hearing loss, sneezing and sore throat.   Eyes: Negative for photophobia.  Respiratory: Negative for choking and chest tightness.   Cardiovascular: Negative for chest pain and palpitations.  Gastrointestinal: Positive for abdominal distention and nausea. Negative for abdominal pain, constipation and vomiting.  Endocrine: Negative for polyphagia and polyuria.  Genitourinary: Negative for flank pain.  Musculoskeletal: Positive for neck pain.  Neurological: Negative for tingling, tremors, seizures, syncope, facial asymmetry, speech difficulty, weakness, numbness and headaches.  All other systems reviewed and are negative.    Physical Exam Updated Vital Signs BP 104/60 (BP Location: Left Arm)   Pulse 68   Temp 97.6 F (36.4 C) (Tympanic)   Resp (!) 21   Ht 5\' 9"  (1.753 m)   Wt 83.9 kg   SpO2 99%   BMI 27.32 kg/m   Physical Exam  Constitutional: She is oriented to person, place, and time. She appears well-developed and well-nourished. No distress.  HENT:  Head: Normocephalic and atraumatic.  Right Ear: External ear normal.   Left Ear: External ear normal.  Nose: Nose normal.  Mouth/Throat: Oropharynx is clear and moist. No oropharyngeal exudate.  Eyes: Pupils are equal, round, and reactive to light. Conjunctivae and EOM are normal.  Neck: Phonation normal. No spinous process tenderness present. No neck rigidity. No tracheal deviation and normal range of motion present. No Brudzinski's sign and no Kernig's sign noted. No thyroid mass present.    Cardiovascular: Normal rate, regular rhythm, normal heart sounds and intact distal pulses.  Pulmonary/Chest: Effort normal and breath sounds normal. No stridor. She has no wheezes. She has no rales.  Abdominal: Soft. Bowel sounds are normal. She exhibits no mass. There is no  tenderness. There is no rebound and no guarding.  Musculoskeletal: Normal range of motion. She exhibits no tenderness.  Neurological: She is alert and oriented to person, place, and time. She displays normal reflexes. No cranial nerve deficit.  Skin: Skin is warm and dry. Capillary refill takes less than 2 seconds.  Psychiatric: She has a normal mood and affect.     ED Treatments / Results  Labs (all labs ordered are listed, but only abnormal results are displayed) Results for orders placed or performed during the hospital encounter of 62/83/66  Basic metabolic panel  Result Value Ref Range   Sodium 138 135 - 145 mmol/L   Potassium 3.9 3.5 - 5.1 mmol/L   Chloride 103 98 - 111 mmol/L   CO2 26 22 - 32 mmol/L   Glucose, Bld 138 (H) 70 - 99 mg/dL   BUN 22 (H) 6 - 20 mg/dL   Creatinine, Ser 1.02 (H) 0.44 - 1.00 mg/dL   Calcium 9.1 8.9 - 10.3 mg/dL   GFR calc non Af Amer >60 >60 mL/min   GFR calc Af Amer >60 >60 mL/min   Anion gap 9 5 - 15  CBC  Result Value Ref Range   WBC 8.7 4.0 - 10.5 K/uL   RBC 4.23 3.87 - 5.11 MIL/uL   Hemoglobin 13.9 12.0 - 15.0 g/dL   HCT 40.7 36.0 - 46.0 %   MCV 96.2 78.0 - 100.0 fL   MCH 32.9 26.0 - 34.0 pg   MCHC 34.2 30.0 - 36.0 g/dL   RDW 12.3 11.5 - 15.5 %     Platelets 207 150 - 400 K/uL  Urinalysis, Routine w reflex microscopic  Result Value Ref Range   Color, Urine YELLOW YELLOW   APPearance CLEAR CLEAR   Specific Gravity, Urine 1.039 (H) 1.005 - 1.030   pH 6.0 5.0 - 8.0   Glucose, UA NEGATIVE NEGATIVE mg/dL   Hgb urine dipstick SMALL (A) NEGATIVE   Bilirubin Urine NEGATIVE NEGATIVE   Ketones, ur NEGATIVE NEGATIVE mg/dL   Protein, ur NEGATIVE NEGATIVE mg/dL   Nitrite NEGATIVE NEGATIVE   Leukocytes, UA NEGATIVE NEGATIVE   RBC / HPF 0-5 0 - 5 RBC/hpf   WBC, UA 0-5 0 - 5 WBC/hpf   Bacteria, UA RARE (A) NONE SEEN   Squamous Epithelial / LPF 0-5 0 - 5  Rapid urine drug screen (hospital performed)  Result Value Ref Range   Opiates NONE DETECTED NONE DETECTED   Cocaine NONE DETECTED NONE DETECTED   Benzodiazepines NONE DETECTED NONE DETECTED   Amphetamines NONE DETECTED NONE DETECTED   Tetrahydrocannabinol NONE DETECTED NONE DETECTED   Barbiturates NONE DETECTED NONE DETECTED  Troponin I  Result Value Ref Range   Troponin I <0.03 <0.03 ng/mL  Protime-INR  Result Value Ref Range   Prothrombin Time 13.8 11.4 - 15.2 seconds   INR 1.07   CBG monitoring, ED  Result Value Ref Range   Glucose-Capillary 118 (H) 70 - 99 mg/dL  I-Stat beta hCG blood, ED  Result Value Ref Range   I-stat hCG, quantitative 7.6 (H) <5 mIU/mL   Comment 3          I-Stat Chem 8, ED  Result Value Ref Range   Sodium 137 135 - 145 mmol/L   Potassium 3.8 3.5 - 5.1 mmol/L   Chloride 102 98 - 111 mmol/L   BUN 23 (H) 6 - 20 mg/dL   Creatinine, Ser 1.00 0.44 - 1.00 mg/dL   Glucose, Bld 134 (H)  70 - 99 mg/dL   Calcium, Ion 1.11 (L) 1.15 - 1.40 mmol/L   TCO2 23 22 - 32 mmol/L   Hemoglobin 13.6 12.0 - 15.0 g/dL   HCT 40.0 36.0 - 46.0 %   Ct Angio Head W Or Wo Contrast  Result Date: 05/21/2018 CLINICAL DATA:  Neck pain and shortness of breath after removing sweater. EXAM: CT ANGIOGRAPHY HEAD AND NECK TECHNIQUE: Multidetector CT imaging of the head and neck was  performed using the standard protocol during bolus administration of intravenous contrast. Multiplanar CT image reconstructions and MIPs were obtained to evaluate the vascular anatomy. Carotid stenosis measurements (when applicable) are obtained utilizing NASCET criteria, using the distal internal carotid diameter as the denominator. CONTRAST:  84mL ISOVUE-370 IOPAMIDOL (ISOVUE-370) INJECTION 76% COMPARISON:  None. FINDINGS: CT HEAD FINDINGS BRAIN: No intraparenchymal hemorrhage, mass effect nor midline shift. The ventricles and sulci are normal. No acute large vascular territory infarcts. No abnormal extra-axial fluid collections. Chronic appearing small LEFT cerebellar infarcts. Basal cisterns are patent. VASCULAR: Unremarkable. SKULL/SOFT TISSUES: No skull fracture. Old nondisplaced bilateral nasal bone fractures. No significant soft tissue swelling. ORBITS/SINUSES: The included ocular globes and orbital contents are normal.Trace paranasal sinus mucosal thickening. Status post FESS. A mastoid air cells are well aerated. OTHER: None. CTA NECK FINDINGS: AORTIC ARCH: Normal appearance of the thoracic arch, normal branch pattern. The origins of the innominate, left Common carotid artery and subclavian artery are widely patent. RIGHT CAROTID SYSTEM: Common carotid artery is patent. Normal appearance of the carotid bifurcation without hemodynamically significant stenosis by NASCET criteria. Normal appearance of the internal carotid artery. LEFT CAROTID SYSTEM: Common carotid artery is patent. Normal appearance of the carotid bifurcation without hemodynamically significant stenosis by NASCET criteria. Normal appearance of the internal carotid artery. VERTEBRAL ARTERIES:Left vertebral artery is dominant. Normal appearance of the vertebral arteries, widely patent. SKELETON: No acute osseous process though bone windows have not been submitted. OTHER NECK: LEFT supraclavicular soft tissue swelling with moderate volume free  fluid mass effect on undersurface LEFT sternocleidomastoid muscle. Edema extending into superior mediastinum. UPPER CHEST: Included lung apices are clear. No superior mediastinal lymphadenopathy. CTA HEAD FINDINGS: ANTERIOR CIRCULATION: Patent cervical internal carotid arteries, petrous, cavernous and supra clinoid internal carotid arteries. Patent anterior communicating artery. Patent anterior and middle cerebral arteries, mild luminal irregularity compatible with atherosclerosis. No large vessel occlusion, significant stenosis, contrast extravasation or aneurysm. POSTERIOR CIRCULATION: Patent vertebral arteries, vertebrobasilar junction and basilar artery, as well as main branch vessels. Patent posterior cerebral arteries, mild luminal irregularity compatible with atherosclerosis. No large vessel occlusion, significant stenosis, contrast extravasation or aneurysm. VENOUS SINUSES: Major dural venous sinuses are patent though not tailored for evaluation on this angiographic examination. ANATOMIC VARIANTS: Persistent RIGHT trigeminal artery. DELAYED PHASE: No abnormal intracranial enhancement. MIP images reviewed. IMPRESSION: CT HEAD: 1. No acute intracranial process. 2. Chronic appearing small LEFT cerebellar infarcts. CTA NECK: 1. LEFT supraclavicular soft tissue swelling and effusion concerning for hemorrhage. No focal fluid collection or acute vascular injury. 2. Otherwise negative CTA NECK. CTA HEAD: 1. No emergent large vessel occlusion or flow-limiting stenosis. 2. Mild atherosclerosis. Electronically Signed   By: Elon Alas M.D.   On: 05/21/2018 02:02   Dg Chest 2 View  Result Date: 05/21/2018 CLINICAL DATA:  Short of breath EXAM: CHEST - 2 VIEW COMPARISON:  01/17/2013 FINDINGS: The heart size and mediastinal contours are within normal limits. Both lungs are clear. The visualized skeletal structures are unremarkable. IMPRESSION: No active cardiopulmonary disease. Electronically Signed  By: Donavan Foil M.D.   On: 05/21/2018 02:11   Ct Angio Neck W And/or Wo Contrast  Result Date: 05/21/2018 CLINICAL DATA:  Neck pain and shortness of breath after removing sweater. EXAM: CT ANGIOGRAPHY HEAD AND NECK TECHNIQUE: Multidetector CT imaging of the head and neck was performed using the standard protocol during bolus administration of intravenous contrast. Multiplanar CT image reconstructions and MIPs were obtained to evaluate the vascular anatomy. Carotid stenosis measurements (when applicable) are obtained utilizing NASCET criteria, using the distal internal carotid diameter as the denominator. CONTRAST:  51mL ISOVUE-370 IOPAMIDOL (ISOVUE-370) INJECTION 76% COMPARISON:  None. FINDINGS: CT HEAD FINDINGS BRAIN: No intraparenchymal hemorrhage, mass effect nor midline shift. The ventricles and sulci are normal. No acute large vascular territory infarcts. No abnormal extra-axial fluid collections. Chronic appearing small LEFT cerebellar infarcts. Basal cisterns are patent. VASCULAR: Unremarkable. SKULL/SOFT TISSUES: No skull fracture. Old nondisplaced bilateral nasal bone fractures. No significant soft tissue swelling. ORBITS/SINUSES: The included ocular globes and orbital contents are normal.Trace paranasal sinus mucosal thickening. Status post FESS. A mastoid air cells are well aerated. OTHER: None. CTA NECK FINDINGS: AORTIC ARCH: Normal appearance of the thoracic arch, normal branch pattern. The origins of the innominate, left Common carotid artery and subclavian artery are widely patent. RIGHT CAROTID SYSTEM: Common carotid artery is patent. Normal appearance of the carotid bifurcation without hemodynamically significant stenosis by NASCET criteria. Normal appearance of the internal carotid artery. LEFT CAROTID SYSTEM: Common carotid artery is patent. Normal appearance of the carotid bifurcation without hemodynamically significant stenosis by NASCET criteria. Normal appearance of the internal carotid artery.  VERTEBRAL ARTERIES:Left vertebral artery is dominant. Normal appearance of the vertebral arteries, widely patent. SKELETON: No acute osseous process though bone windows have not been submitted. OTHER NECK: LEFT supraclavicular soft tissue swelling with moderate volume free fluid mass effect on undersurface LEFT sternocleidomastoid muscle. Edema extending into superior mediastinum. UPPER CHEST: Included lung apices are clear. No superior mediastinal lymphadenopathy. CTA HEAD FINDINGS: ANTERIOR CIRCULATION: Patent cervical internal carotid arteries, petrous, cavernous and supra clinoid internal carotid arteries. Patent anterior communicating artery. Patent anterior and middle cerebral arteries, mild luminal irregularity compatible with atherosclerosis. No large vessel occlusion, significant stenosis, contrast extravasation or aneurysm. POSTERIOR CIRCULATION: Patent vertebral arteries, vertebrobasilar junction and basilar artery, as well as main branch vessels. Patent posterior cerebral arteries, mild luminal irregularity compatible with atherosclerosis. No large vessel occlusion, significant stenosis, contrast extravasation or aneurysm. VENOUS SINUSES: Major dural venous sinuses are patent though not tailored for evaluation on this angiographic examination. ANATOMIC VARIANTS: Persistent RIGHT trigeminal artery. DELAYED PHASE: No abnormal intracranial enhancement. MIP images reviewed. IMPRESSION: CT HEAD: 1. No acute intracranial process. 2. Chronic appearing small LEFT cerebellar infarcts. CTA NECK: 1. LEFT supraclavicular soft tissue swelling and effusion concerning for hemorrhage. No focal fluid collection or acute vascular injury. 2. Otherwise negative CTA NECK. CTA HEAD: 1. No emergent large vessel occlusion or flow-limiting stenosis. 2. Mild atherosclerosis. Electronically Signed   By: Elon Alas M.D.   On: 05/21/2018 02:02    EKG EKG Interpretation  Date/Time:  Wednesday May 21 2018 00:48:30  EDT Ventricular Rate:  66 PR Interval:    QRS Duration: 101 QT Interval:  465 QTC Calculation: 488 R Axis:   58 Text Interpretation:  Sinus rhythm Confirmed by Randal Buba, Ashea Winiarski ([id number]) on 05/21/2018 1:48:10 AM  Procedures Procedures (including critical care time)  Medications Ordered in ED Medications  acetaminophen (OFIRMEV) IV 1,000 mg (0 mg Intravenous Stopped 05/21/18 0512)  iopamidol (ISOVUE-370)  76 % injection 50 mL (50 mLs Intravenous Contrast Given 05/21/18 0121)  ondansetron (ZOFRAN) injection 4 mg (4 mg Intravenous Given 05/21/18 0418)  sodium chloride 0.9 % bolus 1,000 mL (0 mLs Intravenous Stopped 05/21/18 0513)  iohexol (OMNIPAQUE) 300 MG/ML solution 75 mL (60 mLs Intravenous Contrast Given 05/21/18 0537)   Case d/w Donne Hazel who will see the patient and admit the patient  Type and screen ordered, will add second bolus to patient    MDM Number of Diagnoses or Management Options Supraclavicular fossa fullness:  Syncope and collapse:  Critical Care Total time providing critical care: 30-74 minutes  MDM Reviewed: previous chart, nursing note and vitals Interpretation: labs (normal kidney function, fluid collection in left supraclavicular fossa) Total time providing critical care: 30-74 minutes. This excludes time spent performing separately reportable procedures and services. Consults: trauma  CRITICAL CARE Performed by: Carlisle Beers Total critical care time: 60 minutes Critical care time was exclusive of separately billable procedures and treating other patients. Critical care was necessary to treat or prevent imminent or life-threatening deterioration. Critical care was time spent personally by me on the following activities: development of treatment plan with patient and/or surrogate as well as nursing, discussions with consultants, evaluation of patient's response to treatment, examination of patient, obtaining history from patient or surrogate, ordering and  performing treatments and interventions, ordering and review of laboratory studies, ordering and review of radiographic studies, pulse oximetry and re-evaluation of patient's condition.   Final Clinical Impressions(s) / ED Diagnoses    Admit for retroperitoneal bleed and pectoralis tear     Lealand Elting, MD 05/21/18 [id number]

## 2018-05-21 NOTE — Evaluation (Signed)
Physical Therapy Evaluation Patient Details Name: Elizabeth Dominguez MRN: [id number] DOB: [birth year] Today's Date: 05/21/2018   History of Present Illness  Pt is a 57 y/o female admitted secondary to popping sensation when taking off a shirt. Found to have L pectoralis major/minor strain. CT of head negative for acute abnormality, however, other imaging revealed retroperitoneal fluid. PMH includes anxiety.   Clinical Impression  Pt admitted secondary to problem above with deficits below. Pt very guarded during gait and required min guard to supervision. Educated/practiced gentle ROM exercises for LUE and cervical ROM within pain free range. Will continue to follow acutely to maximize functional mobility independence and safety.     Follow Up Recommendations No PT follow up;Supervision for mobility/OOB(outpatient PT once cleared by MD )    Equipment Recommendations  None recommended by PT    Recommendations for Other Services       Precautions / Restrictions Precautions Precautions: None Restrictions Weight Bearing Restrictions: No      Mobility  Bed Mobility Overal bed mobility: Needs Assistance Bed Mobility: Rolling;Sidelying to Sit Rolling: Supervision Sidelying to sit: Supervision       General bed mobility comments: Supervision for safety.   Transfers Overall transfer level: Needs assistance Equipment used: None Transfers: Sit to/from Stand Sit to Stand: Min guard         General transfer comment: Min guard for safety. No LOB noted.   Ambulation/Gait Ambulation/Gait assistance: Min guard;Supervision Gait Distance (Feet): 150 Feet Assistive device: None Gait Pattern/deviations: Step-through pattern;Decreased stride length Gait velocity: Decreased    General Gait Details: Pt very guarded throughout gait. No LOB noted, however. Limited arm swing noted on LUE.   Stairs            Wheelchair Mobility    Modified Rankin (Stroke Patients Only)        Balance Overall balance assessment: Needs assistance Sitting-balance support: No upper extremity supported;Feet supported Sitting balance-Leahy Scale: Good     Standing balance support: No upper extremity supported;During functional activity Standing balance-Leahy Scale: Fair                               Pertinent Vitals/Pain Pain Assessment: Faces Faces Pain Scale: Hurts little more Pain Location: L clavicular/pectoral area  Pain Descriptors / Indicators: Grimacing Pain Intervention(s): Limited activity within patient's tolerance;Monitored during session;Repositioned    Home Living Family/patient expects to be discharged to:: Private residence Living Arrangements: Spouse/significant other Available Help at Discharge: Family;Available PRN/intermittently Type of Home: House Home Access: Level entry     Home Layout: One level Home Equipment: None      Prior Function Level of Independence: Independent               Hand Dominance   Dominant Hand: (ambidextrous )    Extremity/Trunk Assessment   Upper Extremity Assessment Upper Extremity Assessment: Overall WFL for tasks assessed(Limited ROM in LUE secondary to pectoral tear. )    Lower Extremity Assessment Lower Extremity Assessment: Overall WFL for tasks assessed    Cervical / Trunk Assessment Cervical / Trunk Assessment: Other exceptions Cervical / Trunk Exceptions: Limited cervical ROM secondary to stiffness and pain; especially in neck extension and rotation to L/R  Communication   Communication: No difficulties  Cognition Arousal/Alertness: Awake/alert Behavior During Therapy: WFL for tasks assessed/performed Overall Cognitive Status: Within Functional Limits for tasks assessed  General Comments General comments (skin integrity, edema, etc.): Reviewed gentle ROM activities for C spine and LUE. Educated about use of ice to help with  pain/swelling.     Exercises General Exercises - Upper Extremity Shoulder Flexion: AROM;Left;5 reps;Seated;Limitations Shoulder Flexion Limitations: Limited to shoulder height secondary to pain Other Exercises Other Exercises: Shoulder IR/ER with elbow at side through pain free ROM X5.  Other Exercises: Cervical flexion/extension through pain free ROM X5.  Other Exercises: Cervical rotation to L/R through pain free ROM X10.    Assessment/Plan    PT Assessment Patient needs continued PT services  PT Problem List Decreased range of motion;Decreased mobility;Decreased knowledge of precautions;Pain       PT Treatment Interventions Gait training;Functional mobility training;Therapeutic activities;Therapeutic exercise;Patient/family education    PT Goals (Current goals can be found in the Care Plan section)  Acute Rehab PT Goals Patient Stated Goal: "to feel better" PT Goal Formulation: With patient Time For Goal Achievement: 06/04/18 Potential to Achieve Goals: Good    Frequency Min 3X/week   Barriers to discharge        Co-evaluation               AM-PAC PT "6 Clicks" Daily Activity  Outcome Measure Difficulty turning over in bed (including adjusting bedclothes, sheets and blankets)?: None Difficulty moving from lying on back to sitting on the side of the bed? : A Little Difficulty sitting down on and standing up from a chair with arms (e.g., wheelchair, bedside commode, etc,.)?: Unable Help needed moving to and from a bed to chair (including a wheelchair)?: A Little Help needed walking in hospital room?: A Little Help needed climbing 3-5 steps with a railing? : A Little 6 Click Score: 17    End of Session Equipment Utilized During Treatment: Gait belt Activity Tolerance: Patient tolerated treatment well Patient left: in chair;with call bell/phone within reach Nurse Communication: Mobility status PT Visit Diagnosis: Other abnormalities of gait and mobility  (R26.89);Pain Pain - Right/Left: Left Pain - part of body: (pectoral area )    Time: 1610-9604 PT Time Calculation (min) (ACUTE ONLY): 19 min   Charges:   PT Evaluation $PT Eval Low Complexity: 1 Low          Leighton Ruff, PT, DPT  Acute Rehabilitation Services  Pager: [phone]   Rudean Hitt 05/21/2018, 12:15 PM

## 2018-05-21 NOTE — ED Notes (Signed)
EDP at bedside  

## 2018-05-21 NOTE — Consult Note (Signed)
Medical Consultation   Elizabeth Dominguez  CNO:[id number]  DOB: Jan 29, 1961  DOA: 05/21/2018  PCP: Hulan Fess, MD   Outpatient Specialists: [facility] - GYN   Requesting physician: Georganna Skeans, Trauma  Reason for consultation: Retroperitoneal fluid, leg swelling, early satiety   History of Present Illness: Elizabeth Dominguez is an 57 y.o. female depression, anxiety, kidney stone, gallstone who presented with a marked pectoralis tear after taking her shirt off.  She was taking her shirt off last night about 915.  She crossed her arms because it was a tight fit.  She twisted wrong and felt something pop in her left neck.  About 15 minutes later, she noticed that it hurt to swallow.  She was hoping she wasn't getting sick.  She felt her neck and noticed a puffy place not previously there.  She laid down and noticed on her left side that she couldn't breathe, couldn't get any air.  She looked in the mirror and saw more edema on her left neck/chest.  She has noticed about 6 weeks of LE edema when she is on her feet.  She did have some abdominal TTP on exam and thought back and noticed that maybe 2 days ago she was sore, thought it was related to recent exercise.  Her TTP was in the right flank/RLQ.  She is still feeling a pressure in her chest.  +orthopnea/PND ongoing.  7 pounds unexpected weight gain in 6 weeks.  +bloated.  +early satiety.  +hot flashes - ?night sweats; hot flashes have been happening during the day during the last few months, which is different for her.     Review of Systems:  ROS As per HPI otherwise 10 point review of systems negative.    Past Medical History: Past Medical History:  Diagnosis Date  . Anxiety    PANIC ATTACKS  . Cancer ([place])    skin  . Complication of anesthesia    PT STATES SHE GETS ANXIOUS BEFORE SURGERIES AND HAS NAUSEA BEFORE SURGERY.  . Fainting episodes    YRS AGO --NO CAUSE FOUND- POSS SEIZURES--TOOK TOPAMAX FOR A WHILE--BUT OFF  THE MEDICINE NOW FOR 7 YEARS AND NO PROBLEM SINCE  . History of kidney stones   . Low blood sugar    USUALLY  . Osteopenia   . Pectoralis muscle strain 05/21/2018  . PONV (postoperative nausea and vomiting)   . Seasonal allergies     Past Surgical History: Past Surgical History:  Procedure Laterality Date  . ABDOMINAL HYSTERECTOMY  2008  . CHOLECYSTECTOMY  01/15/13  . CHOLECYSTECTOMY N/A 01/15/2013   Procedure: LAPAROSCOPIC CHOLECYSTECTOMY WITH INTRAOPERATIVE CHOLANGIOGRAM;  Surgeon: Stark Klein, MD;  Location: [facility];  Service: General;  Laterality: N/A;  . HERNIA REPAIR  10/2011   umb hernia repair  . INCONTINENCE SURGERY  2010   BLADDER SLING  . [place]  . SHOULDER ARTHROSCOPY     left  . UMBILICAL HERNIA REPAIR  10/18/11     Allergies:   Allergies  Allergen Reactions  . Ibuprofen Other (See Comments)    Abdominal pain  . Dilaudid [Hydromorphone Hcl] Itching and Rash     Social History:  reports that she has never smoked. She has never used smokeless tobacco. She reports that she drinks about 10.0 standard drinks of alcohol per week. She reports that she does not use drugs.   Family History: Family  History  Problem Relation Age of Onset  . Hypertension Mother   . Breast cancer Mother 75  . Skin cancer Father   . Parkinson's disease Father   . Diabetes Sister   . Heart disease Maternal Grandfather   . Colon cancer Neg Hx   . Esophageal cancer Neg Hx   . Rectal cancer Neg Hx   . Stomach cancer Neg Hx       Physical Exam: Vitals:   05/21/18 0930 05/21/18 0947 05/21/18 1018 05/21/18 1023  BP:  113/63 118/76 (!) 115/58  Pulse: 66 69  67  Resp: 13 17  18   Temp:    98.2 F (36.8 C)  TempSrc:    Oral  SpO2: 98% 100%  98%  Weight:    93 kg  Height:    5\' 8"  (1.727 m)    Constitutional: Alert and awake, oriented x3, not in any acute distress. Eyes: PERLA, EOMI, irises appear normal, anicteric sclera,  ENMT: external ears and  nose appear normal, normal hearing, Lips appear normal, oropharynx mucosa, tongue, posterior pharynx appear normal  Neck:  Subcutaneous fullness along her left clavicular region, extending up into the inferior neck and down to the superior breast region       CVS: S1-S2 clear, no murmur rubs or gallops, no LE edema, normal pedal pulses  Respiratory:  clear to auscultation bilaterally, no wheezing, rales or rhonchi. Respiratory effort normal. No accessory muscle use.  Abdomen: soft with tenderness in the RLQ and extending into the right flank,  Mildly distended diffusely, normal bowel sounds, no hepatosplenomegaly, no hernias  Musculoskeletal: : no cyanosis, clubbing or edema noted bilaterally Neuro: Cranial nerves II-XII intact, strength, sensation, reflexes Psych: judgement and insight appear normal, stable mood and affect, mental status Skin: no rashes or lesions or ulcers, no induration or nodules    Data reviewed:  I have personally reviewed the recent labs and imaging studies  Pertinent Labs:   Glucose 138 BUN 22/Creatinine 1.02/GFR >60 Troponin <0.03 HCG 7.6 CBC WNL Ionized calcium 1.11 INR 1.07 UA: small Hgb, rare bacteria UDS negative  Inpatient Medications:   Scheduled Meds: . DULoxetine  30 mg Oral Daily  . magnesium gluconate  500 mg Oral Daily  . multivitamin with minerals   Oral Daily  . sodium chloride flush  3 mL Intravenous Q12H   Continuous Infusions: . sodium chloride    . acetaminophen Stopped (05/21/18 5643)     Radiological Exams on Admission: Ct Abdomen Pelvis Wo Contrast  Result Date: 05/21/2018 CLINICAL DATA:  Retroperitoneal fluid in the upper abdomen on chest CT. EXAM: CT ABDOMEN AND PELVIS WITHOUT CONTRAST TECHNIQUE: Multidetector CT imaging of the abdomen and pelvis was performed following the standard protocol without IV contrast. COMPARISON:  Chest CT earlier this day.  Abdominal CT 08/31/2014 FINDINGS: Lower chest: Small left pleural  effusion. Bibasilar atelectasis. Dedicated chest CT performed immediately prior. Hepatobiliary: No focal abnormality. Clips in the gallbladder fossa postcholecystectomy. No biliary dilatation. Pancreas: Mild retroperitoneal stranding extends to the pancreatic head without discrete peripancreatic inflammatory change. No ductal dilatation. Spleen: Normal in size without focal abnormality. Adrenals/Urinary Tract: No adrenal nodule. Mild bilateral perinephric edema, right greater than left. No hydronephrosis. Excreted IV contrast within both renal collecting systems, ureters and urinary bladder. Stomach/Bowel: Stomach physiologically distended. Retroperitoneal stranding extends to the duodenum. No duodenal wall thickening. No small or large bowel wall thickening or inflammatory change. Moderate stool throughout the colon. There is colonic tortuosity including mesenteric swirling that was  seen on prior exam. Appendix tentatively identified and normal. Vascular/Lymphatic: Retroperitoneal stranding and fluid, extending from the subdiaphragmatic region to the pelvis, fluid is greater on the right. Fluid measures simple fluid density. No abdominopelvic adenopathy. Reproductive: Status post hysterectomy. No adnexal masses. Other: Retroperitoneal haziness, stranding, and free fluid, right greater than left. Small amount of free fluid tracks in the pericolic gutters. No free air. Small fat containing umbilical hernia. Musculoskeletal: No acute fracture of the lumbar spine or bony pelvis. Vertebral body hemangioma within L1. IMPRESSION: 1. Moderate volume of retroperitoneal edema and free fluid, right greater than left, of uncertain etiology. Fluid measures simple fluid density and argues against hemorrhage. Greatest fluid volume appears adjacent to the right psoas muscle. Consider follow-up CT. 2. Colonic tortuosity, unchanged from CT 08/31/2014. No evidence of bowel inflammation. Electronically Signed   By: Jeb Levering  M.D.   On: 05/21/2018 06:53   Ct Angio Head W Or Wo Contrast  Result Date: 05/21/2018 CLINICAL DATA:  Neck pain and shortness of breath after removing sweater. EXAM: CT ANGIOGRAPHY HEAD AND NECK TECHNIQUE: Multidetector CT imaging of the head and neck was performed using the standard protocol during bolus administration of intravenous contrast. Multiplanar CT image reconstructions and MIPs were obtained to evaluate the vascular anatomy. Carotid stenosis measurements (when applicable) are obtained utilizing NASCET criteria, using the distal internal carotid diameter as the denominator. CONTRAST:  79mL ISOVUE-370 IOPAMIDOL (ISOVUE-370) INJECTION 76% COMPARISON:  None. FINDINGS: CT HEAD FINDINGS BRAIN: No intraparenchymal hemorrhage, mass effect nor midline shift. The ventricles and sulci are normal. No acute large vascular territory infarcts. No abnormal extra-axial fluid collections. Chronic appearing small LEFT cerebellar infarcts. Basal cisterns are patent. VASCULAR: Unremarkable. SKULL/SOFT TISSUES: No skull fracture. Old nondisplaced bilateral nasal bone fractures. No significant soft tissue swelling. ORBITS/SINUSES: The included ocular globes and orbital contents are normal.Trace paranasal sinus mucosal thickening. Status post FESS. A mastoid air cells are well aerated. OTHER: None. CTA NECK FINDINGS: AORTIC ARCH: Normal appearance of the thoracic arch, normal branch pattern. The origins of the innominate, left Common carotid artery and subclavian artery are widely patent. RIGHT CAROTID SYSTEM: Common carotid artery is patent. Normal appearance of the carotid bifurcation without hemodynamically significant stenosis by NASCET criteria. Normal appearance of the internal carotid artery. LEFT CAROTID SYSTEM: Common carotid artery is patent. Normal appearance of the carotid bifurcation without hemodynamically significant stenosis by NASCET criteria. Normal appearance of the internal carotid artery. VERTEBRAL  ARTERIES:Left vertebral artery is dominant. Normal appearance of the vertebral arteries, widely patent. SKELETON: No acute osseous process though bone windows have not been submitted. OTHER NECK: LEFT supraclavicular soft tissue swelling with moderate volume free fluid mass effect on undersurface LEFT sternocleidomastoid muscle. Edema extending into superior mediastinum. UPPER CHEST: Included lung apices are clear. No superior mediastinal lymphadenopathy. CTA HEAD FINDINGS: ANTERIOR CIRCULATION: Patent cervical internal carotid arteries, petrous, cavernous and supra clinoid internal carotid arteries. Patent anterior communicating artery. Patent anterior and middle cerebral arteries, mild luminal irregularity compatible with atherosclerosis. No large vessel occlusion, significant stenosis, contrast extravasation or aneurysm. POSTERIOR CIRCULATION: Patent vertebral arteries, vertebrobasilar junction and basilar artery, as well as main branch vessels. Patent posterior cerebral arteries, mild luminal irregularity compatible with atherosclerosis. No large vessel occlusion, significant stenosis, contrast extravasation or aneurysm. VENOUS SINUSES: Major dural venous sinuses are patent though not tailored for evaluation on this angiographic examination. ANATOMIC VARIANTS: Persistent RIGHT trigeminal artery. DELAYED PHASE: No abnormal intracranial enhancement. MIP images reviewed. IMPRESSION: CT HEAD: 1. No acute intracranial process.  2. Chronic appearing small LEFT cerebellar infarcts. CTA NECK: 1. LEFT supraclavicular soft tissue swelling and effusion concerning for hemorrhage. No focal fluid collection or acute vascular injury. 2. Otherwise negative CTA NECK. CTA HEAD: 1. No emergent large vessel occlusion or flow-limiting stenosis. 2. Mild atherosclerosis. Electronically Signed   By: Elon Alas M.D.   On: 05/21/2018 02:02   Dg Chest 2 View  Result Date: 05/21/2018 CLINICAL DATA:  Short of breath EXAM: CHEST -  2 VIEW COMPARISON:  01/17/2013 FINDINGS: The heart size and mediastinal contours are within normal limits. Both lungs are clear. The visualized skeletal structures are unremarkable. IMPRESSION: No active cardiopulmonary disease. Electronically Signed   By: Donavan Foil M.D.   On: 05/21/2018 02:11   Ct Angio Neck W And/or Wo Contrast  Result Date: 05/21/2018 CLINICAL DATA:  Neck pain and shortness of breath after removing sweater. EXAM: CT ANGIOGRAPHY HEAD AND NECK TECHNIQUE: Multidetector CT imaging of the head and neck was performed using the standard protocol during bolus administration of intravenous contrast. Multiplanar CT image reconstructions and MIPs were obtained to evaluate the vascular anatomy. Carotid stenosis measurements (when applicable) are obtained utilizing NASCET criteria, using the distal internal carotid diameter as the denominator. CONTRAST:  66mL ISOVUE-370 IOPAMIDOL (ISOVUE-370) INJECTION 76% COMPARISON:  None. FINDINGS: CT HEAD FINDINGS BRAIN: No intraparenchymal hemorrhage, mass effect nor midline shift. The ventricles and sulci are normal. No acute large vascular territory infarcts. No abnormal extra-axial fluid collections. Chronic appearing small LEFT cerebellar infarcts. Basal cisterns are patent. VASCULAR: Unremarkable. SKULL/SOFT TISSUES: No skull fracture. Old nondisplaced bilateral nasal bone fractures. No significant soft tissue swelling. ORBITS/SINUSES: The included ocular globes and orbital contents are normal.Trace paranasal sinus mucosal thickening. Status post FESS. A mastoid air cells are well aerated. OTHER: None. CTA NECK FINDINGS: AORTIC ARCH: Normal appearance of the thoracic arch, normal branch pattern. The origins of the innominate, left Common carotid artery and subclavian artery are widely patent. RIGHT CAROTID SYSTEM: Common carotid artery is patent. Normal appearance of the carotid bifurcation without hemodynamically significant stenosis by NASCET criteria.  Normal appearance of the internal carotid artery. LEFT CAROTID SYSTEM: Common carotid artery is patent. Normal appearance of the carotid bifurcation without hemodynamically significant stenosis by NASCET criteria. Normal appearance of the internal carotid artery. VERTEBRAL ARTERIES:Left vertebral artery is dominant. Normal appearance of the vertebral arteries, widely patent. SKELETON: No acute osseous process though bone windows have not been submitted. OTHER NECK: LEFT supraclavicular soft tissue swelling with moderate volume free fluid mass effect on undersurface LEFT sternocleidomastoid muscle. Edema extending into superior mediastinum. UPPER CHEST: Included lung apices are clear. No superior mediastinal lymphadenopathy. CTA HEAD FINDINGS: ANTERIOR CIRCULATION: Patent cervical internal carotid arteries, petrous, cavernous and supra clinoid internal carotid arteries. Patent anterior communicating artery. Patent anterior and middle cerebral arteries, mild luminal irregularity compatible with atherosclerosis. No large vessel occlusion, significant stenosis, contrast extravasation or aneurysm. POSTERIOR CIRCULATION: Patent vertebral arteries, vertebrobasilar junction and basilar artery, as well as main branch vessels. Patent posterior cerebral arteries, mild luminal irregularity compatible with atherosclerosis. No large vessel occlusion, significant stenosis, contrast extravasation or aneurysm. VENOUS SINUSES: Major dural venous sinuses are patent though not tailored for evaluation on this angiographic examination. ANATOMIC VARIANTS: Persistent RIGHT trigeminal artery. DELAYED PHASE: No abnormal intracranial enhancement. MIP images reviewed. IMPRESSION: CT HEAD: 1. No acute intracranial process. 2. Chronic appearing small LEFT cerebellar infarcts. CTA NECK: 1. LEFT supraclavicular soft tissue swelling and effusion concerning for hemorrhage. No focal fluid collection or acute vascular injury.  2. Otherwise negative CTA  NECK. CTA HEAD: 1. No emergent large vessel occlusion or flow-limiting stenosis. 2. Mild atherosclerosis. Electronically Signed   By: Elon Alas M.D.   On: 05/21/2018 02:02   Ct Chest W Contrast  Result Date: 05/21/2018 CLINICAL DATA:  Neck pain and shortness of breath after removing shirt tonight. Felt a pop. EXAM: CT CHEST WITH CONTRAST TECHNIQUE: Multidetector CT imaging of the chest was performed during intravenous contrast administration. CONTRAST:  51mL OMNIPAQUE IOHEXOL 300 MG/ML  SOLN COMPARISON:  Chest radiograph May 21, 2018 and CT angiogram head and neck May 21, 2018. FINDINGS: CARDIOVASCULAR: Heart and pericardium are unremarkable. Thoracic aorta is normal course and caliber, unremarkable. MEDIASTINUM/NODES: Minimal effusion superior mediastinum. No mediastinal mass. No lymphadenopathy by CT size criteria. Normal appearance of thoracic esophagus though not tailored for evaluation. LUNGS/PLEURA: Tracheobronchial tree is patent, no pneumothorax. Small LEFT pleural effusion. Bibasilar atelectasis. UPPER ABDOMEN: Retroperitoneal and to lesser extent RIGHT upper quadrant fat stranding and effusion. Status post cholecystectomy. MUSCULOSKELETAL: LEFT supraclavicular effusion enlarged LEFT pectoralis minor and medial LEFT pectoralis major. LEFT supraclavicular soft tissue swelling with effusion, subcutaneous fat stranding. No focal fluid collections. Osseous structures are normal. IMPRESSION: 1. LEFT pectoralis major and minor strain (likely high-grade) with LEFT supraclavicular effusion compatible with blood products. 2. Small LEFT pleural effusion. 3. Included abdomen demonstrates small volume retroperitoneal and upper abdomen free fluid concerning for hemorrhage. Recommend CT abdomen and pelvis. 4. Acute findings discussed with and reconfirmed by PA Quincy Carnes on 05/21/2018 at 6:10 am. Electronically Signed   By: Elon Alas M.D.   On: 05/21/2018 06:13     Impression/Recommendations Principal Problem:   Strain of left pectoralis muscle Active Problems:   Retroperitoneal fluid collection   Hyperglycemia  Pectoralis strain with hematoma -Very interesting presentation  -It is not clear why she had such a significant injury from a mundane task and raises the question of musculature friability -Trauma surgery has admitted the patient and is following this issue -They are following with CBC and PT/OT evals  Retroperitoneal fluid collection -She has a confluence of symptoms that are concerning for underlying pelvic malignancy, including early satiety, bloating, and LE edema -Imaging was nonspecific about this fluid collection - moderate volume of retroperitoneal edema and free fluid, R>L, uncertain etiology.  It does not appear to be blood.  The fluid is highest in volume adjacent to the right psoas muscle - which corresponds with her discomfort on exam. -As noted above, with her apparent muscular friability, this is worrisome.  However, this fluid collection does not appear to be hemorrhage. -She has had a hysterectomy but it is not clear in notes or imaging reports whether she still has ovaries. -Today's CT indicates "no adnexal masses" -I discussed the patient briefly with Dr. Matthew Saras, who recommends CA-125 and pelvic US; if negative, he does not recommend further gynecologic work-up. -Given her hot flashes and symptoms, will also add a prolactin level. -Her POC HCG was positive; this is likely a false positive, but if a true positive this again raises concern for pelvic malignancy. -Will continue to follow this interesting patient for now.  Hyperglycemia -May be stress response -Will follow with fasting AM labs and check A1c   Thank you for this consultation.  Our [facility] hospitalist team will follow the patient with you.   Time Spent: 60 minutes  Karmen Bongo M.D. [facility] 05/21/2018, 11:24 AM

## 2018-05-21 NOTE — Care Management (Signed)
This is a no charge note  Pending admission per Dr. Randal Buba  57 year old lady with a past medical history of depression, anxiety, kidney stone, gallstone, who presents with neck pain and shortness of breath and near syncope.  Her symptoms started after taking her shirt off. Reports she felt something pop. She developed nild swelling to L clavicle area.  Chest x-ray negative.  CT head is negative for acute intracranial abnormalities.  CT angiogram of neck and head is negative for LVO and dissection.  Trauma surgeon, Dr. Donne Hazel was consulted, who recommended to get CT of chest with contrast.   Patient was found to have WBC 8.7, INR 1.07, UDS negative, negative urinalysis, electrolytes renal function okay, temperature normal, no tachycardia, oxygen saturation 99%.  Patient is placed on telemetry bed for observation.   Ivor Costa, MD  [facility] Pager [phone]  If 7PM-7AM, please contact night-coverage [email or web address] Password [facility] 05/21/2018, 6:05 AM

## 2018-05-21 NOTE — ED Notes (Signed)
Attempted report 

## 2018-05-21 NOTE — ED Notes (Signed)
Patient transported to CT 

## 2018-05-21 NOTE — H&P (Signed)
Elizabeth Dominguez is an 57 y.o. female.   Chief Complaint: Left upper chest pain and swelling HPI: Elizabeth Dominguez was trying to remove her shirt last night when she felt a popping or tearing sensation in her left upper chest.  She had immediate pain in the area.  She gradually developed increasing swelling in the area and it became a little bit difficult to swallow.  She could not get comfortable when she tried to go to sleep and she came to the emergency room.  CT scan of the chest was done demonstrating a significant tear of her pectoralis major and pectoralis minor musculature with resultant hematoma.  Additionally some evidence of retroperitoneal fluid was seen so she went on to get further evaluation with CT scan of the abdomen and pelvis.  Of note, she reports 58-monthhistory of leg swelling and a sensation of abdominal fullness and early satiety.  She has not had changes in the medications that she takes.  No shortness of breath.  No night sweats.  Past Medical History:  Diagnosis Date  . Anxiety    PANIC ATTACKS  . Cancer ([place]    skin  . Complication of anesthesia    PT STATES SHE GETS ANXIOUS BEFORE SURGERIES AND HAS NAUSEA BEFORE SURGERY.  . Fainting episodes    YRS AGO --NO CAUSE FOUND- POSS SEIZURES--TOOK TOPAMAX FOR A WHILE--BUT OFF THE MEDICINE NOW FOR 7 YEARS AND NO PROBLEM SINCE  . Gallstones    ABD PAIN, AND NAUSEA  . Kidney stones   . Low blood sugar    USUALLY  . Osteopenia   . Seasonal allergies     Past Surgical History:  Procedure Laterality Date  . ABDOMINAL HYSTERECTOMY  2008  . CHOLECYSTECTOMY  01/15/13  . CHOLECYSTECTOMY N/A 01/15/2013   Procedure: LAPAROSCOPIC CHOLECYSTECTOMY WITH INTRAOPERATIVE CHOLANGIOGRAM;  Surgeon: FStark Klein MD;  Location: [facility];  Service: General;  Laterality: N/A;  . HERNIA REPAIR  10/2011   umb hernia repair  . INCONTINENCE SURGERY  2010   BLADDER SLING  . [place] . SHOULDER ARTHROSCOPY     left  . UMBILICAL  HERNIA REPAIR  10/18/11    Family History  Problem Relation Age of Onset  . Hypertension Mother   . Breast cancer Mother 596 . Skin cancer Father   . Parkinson's disease Father   . Diabetes Sister   . Heart disease Maternal Grandfather   . Colon cancer Neg Hx   . Esophageal cancer Neg Hx   . Rectal cancer Neg Hx   . Stomach cancer Neg Hx    Social History:  reports that she has never smoked. She has never used smokeless tobacco. She reports that she drinks alcohol. She reports that she does not use drugs.  Allergies:  Allergies  Allergen Reactions  . Ibuprofen Other (See Comments)    Abdominal pain  . Dilaudid [Hydromorphone Hcl] Itching and Rash     (Not in a hospital admission)  Results for orders placed or performed during the hospital encounter of 05/21/18 (from the past 48 hour(s))  CBG monitoring, ED     Status: Abnormal   Collection Time: 05/21/18 12:31 AM  Result Value Ref Range   Glucose-Capillary 118 (H) 70 - 99 mg/dL  Basic metabolic panel     Status: Abnormal   Collection Time: 05/21/18 12:51 AM  Result Value Ref Range   Sodium 138 135 - 145 mmol/L   Potassium 3.9 3.5 -  5.1 mmol/L   Chloride 103 98 - 111 mmol/L   CO2 26 22 - 32 mmol/L   Glucose, Bld 138 (H) 70 - 99 mg/dL   BUN 22 (H) 6 - 20 mg/dL   Creatinine, Ser 1.02 (H) 0.44 - 1.00 mg/dL   Calcium 9.1 8.9 - 10.3 mg/dL   GFR calc non Af Amer >60 >60 mL/min   GFR calc Af Amer >60 >60 mL/min    Comment: (NOTE) The eGFR has been calculated using the CKD EPI equation. This calculation has not been validated in all clinical situations. eGFR's persistently <60 mL/min signify possible Chronic Kidney Disease.    Anion gap 9 5 - 15    Comment: Performed at [facility] [address], [place] [address]  CBC     Status: None   Collection Time: 05/21/18 12:51 AM  Result Value Ref Range   WBC 8.7 4.0 - 10.5 K/uL   RBC 4.23 3.87 - 5.11 MIL/uL   Hemoglobin 13.9 12.0 - 15.0 g/dL   HCT 40.7  36.0 - 46.0 %   MCV 96.2 78.0 - 100.0 fL   MCH 32.9 26.0 - 34.0 pg   MCHC 34.2 30.0 - 36.0 g/dL   RDW 12.3 11.5 - 15.5 %   Platelets 207 150 - 400 K/uL    Comment: Performed at [facility], [place] [address], [place], [place] [address]  Troponin I     Status: None   Collection Time: 05/21/18 12:51 AM  Result Value Ref Range   Troponin I <0.03 <0.03 ng/mL    Comment: Performed at [facility] [address], [place], [place] [address]  I-Stat beta hCG blood, ED     Status: Abnormal   Collection Time: 05/21/18 12:55 AM  Result Value Ref Range   I-stat hCG, quantitative 7.6 (H) <5 mIU/mL   Comment 3            Comment:   GEST. AGE      CONC.  (mIU/mL)   <=1 WEEK        5 - 50     2 WEEKS       50 - 500     3 WEEKS       100 - 10,000     4 WEEKS     1,000 - 30,000        FEMALE AND NON-PREGNANT FEMALE:     LESS THAN 5 mIU/mL   I-Stat Chem 8, ED     Status: Abnormal   Collection Time: 05/21/18 12:57 AM  Result Value Ref Range   Sodium 137 135 - 145 mmol/L   Potassium 3.8 3.5 - 5.1 mmol/L   Chloride 102 98 - 111 mmol/L   BUN 23 (H) 6 - 20 mg/dL   Creatinine, Ser 1.00 0.44 - 1.00 mg/dL   Glucose, Bld 134 (H) 70 - 99 mg/dL   Calcium, Ion 1.11 (L) 1.15 - 1.40 mmol/L   TCO2 23 22 - 32 mmol/L   Hemoglobin 13.6 12.0 - 15.0 g/dL   HCT 40.0 36.0 - 46.0 %  Urinalysis, Routine w reflex microscopic     Status: Abnormal   Collection Time: 05/21/18  2:26 AM  Result Value Ref Range   Color, Urine YELLOW YELLOW   APPearance CLEAR CLEAR   Specific Gravity, Urine 1.039 (H) 1.005 - 1.030   pH 6.0 5.0 - 8.0   Glucose, UA NEGATIVE NEGATIVE mg/dL   Hgb urine  dipstick SMALL (A) NEGATIVE   Bilirubin Urine NEGATIVE NEGATIVE   Ketones, ur NEGATIVE NEGATIVE mg/dL   Protein, ur NEGATIVE NEGATIVE mg/dL   Nitrite NEGATIVE NEGATIVE   Leukocytes, UA NEGATIVE NEGATIVE   RBC / HPF 0-5 0 - 5 RBC/hpf   WBC, UA 0-5 0 - 5 WBC/hpf   Bacteria, UA RARE (A) NONE SEEN   Squamous Epithelial / LPF 0-5  0 - 5    Comment: Performed at [facility], [place] [address], [place], [place] [address]  Rapid urine drug screen (hospital performed)     Status: None   Collection Time: 05/21/18  2:26 AM  Result Value Ref Range   Opiates NONE DETECTED NONE DETECTED   Cocaine NONE DETECTED NONE DETECTED   Benzodiazepines NONE DETECTED NONE DETECTED   Amphetamines NONE DETECTED NONE DETECTED   Tetrahydrocannabinol NONE DETECTED NONE DETECTED   Barbiturates NONE DETECTED NONE DETECTED    Comment: (NOTE) DRUG SCREEN FOR MEDICAL PURPOSES ONLY.  IF CONFIRMATION IS NEEDED FOR ANY PURPOSE, NOTIFY LAB WITHIN 5 DAYS. LOWEST DETECTABLE LIMITS FOR URINE DRUG SCREEN Drug Class                     Cutoff (ng/mL) Amphetamine and metabolites    1000 Barbiturate and metabolites    200 Benzodiazepine                 119 Tricyclics and metabolites     300 Opiates and metabolites        300 Cocaine and metabolites        300 THC                            50 Performed at [facility], [place] [address], [place], [place] [address]   Protime-INR     Status: None   Collection Time: 05/21/18  3:47 AM  Result Value Ref Range   Prothrombin Time 13.8 11.4 - 15.2 seconds   INR 1.07     Comment: Performed at [facility] [address] [place], [place] [address]  Type and screen     Status: None   Collection Time: 05/21/18  6:26 AM  Result Value Ref Range   ABO/RH(D) O POS    Antibody Screen NEG    Sample Expiration      05/24/2018 Performed at [facility], [place] [address], [place], [place] [address]    Ct Abdomen Pelvis Wo Contrast  Result Date: 05/21/2018 CLINICAL DATA:  Retroperitoneal fluid in the upper abdomen on chest CT. EXAM: CT ABDOMEN AND PELVIS WITHOUT CONTRAST TECHNIQUE: Multidetector CT imaging of the abdomen and pelvis was performed following the standard protocol without IV contrast. COMPARISON:  Chest CT earlier this day.  Abdominal CT 08/31/2014 FINDINGS: Lower chest: Small  left pleural effusion. Bibasilar atelectasis. Dedicated chest CT performed immediately prior. Hepatobiliary: No focal abnormality. Clips in the gallbladder fossa postcholecystectomy. No biliary dilatation. Pancreas: Mild retroperitoneal stranding extends to the pancreatic head without discrete peripancreatic inflammatory change. No ductal dilatation. Spleen: Normal in size without focal abnormality. Adrenals/Urinary Tract: No adrenal nodule. Mild bilateral perinephric edema, right greater than left. No hydronephrosis. Excreted IV contrast within both renal collecting systems, ureters and urinary bladder. Stomach/Bowel: Stomach physiologically distended. Retroperitoneal stranding extends to the duodenum. No duodenal wall thickening. No small or large bowel wall thickening or inflammatory change. Moderate stool throughout the colon. There is colonic  tortuosity including mesenteric swirling that was seen on prior exam. Appendix tentatively identified and normal. Vascular/Lymphatic: Retroperitoneal stranding and fluid, extending from the subdiaphragmatic region to the pelvis, fluid is greater on the right. Fluid measures simple fluid density. No abdominopelvic adenopathy. Reproductive: Status post hysterectomy. No adnexal masses. Other: Retroperitoneal haziness, stranding, and free fluid, right greater than left. Small amount of free fluid tracks in the pericolic gutters. No free air. Small fat containing umbilical hernia. Musculoskeletal: No acute fracture of the lumbar spine or bony pelvis. Vertebral body hemangioma within L1. IMPRESSION: 1. Moderate volume of retroperitoneal edema and free fluid, right greater than left, of uncertain etiology. Fluid measures simple fluid density and argues against hemorrhage. Greatest fluid volume appears adjacent to the right psoas muscle. Consider follow-up CT. 2. Colonic tortuosity, unchanged from CT 08/31/2014. No evidence of bowel inflammation. Electronically Signed   By:  Jeb Levering M.D.   On: 05/21/2018 06:53   Ct Angio Head W Or Wo Contrast  Result Date: 05/21/2018 CLINICAL DATA:  Neck pain and shortness of breath after removing sweater. EXAM: CT ANGIOGRAPHY HEAD AND NECK TECHNIQUE: Multidetector CT imaging of the head and neck was performed using the standard protocol during bolus administration of intravenous contrast. Multiplanar CT image reconstructions and MIPs were obtained to evaluate the vascular anatomy. Carotid stenosis measurements (when applicable) are obtained utilizing NASCET criteria, using the distal internal carotid diameter as the denominator. CONTRAST:  35m ISOVUE-370 IOPAMIDOL (ISOVUE-370) INJECTION 76% COMPARISON:  None. FINDINGS: CT HEAD FINDINGS BRAIN: No intraparenchymal hemorrhage, mass effect nor midline shift. The ventricles and sulci are normal. No acute large vascular territory infarcts. No abnormal extra-axial fluid collections. Chronic appearing small LEFT cerebellar infarcts. Basal cisterns are patent. VASCULAR: Unremarkable. SKULL/SOFT TISSUES: No skull fracture. Old nondisplaced bilateral nasal bone fractures. No significant soft tissue swelling. ORBITS/SINUSES: The included ocular globes and orbital contents are normal.Trace paranasal sinus mucosal thickening. Status post FESS. A mastoid air cells are well aerated. OTHER: None. CTA NECK FINDINGS: AORTIC ARCH: Normal appearance of the thoracic arch, normal branch pattern. The origins of the innominate, left Common carotid artery and subclavian artery are widely patent. RIGHT CAROTID SYSTEM: Common carotid artery is patent. Normal appearance of the carotid bifurcation without hemodynamically significant stenosis by NASCET criteria. Normal appearance of the internal carotid artery. LEFT CAROTID SYSTEM: Common carotid artery is patent. Normal appearance of the carotid bifurcation without hemodynamically significant stenosis by NASCET criteria. Normal appearance of the internal carotid  artery. VERTEBRAL ARTERIES:Left vertebral artery is dominant. Normal appearance of the vertebral arteries, widely patent. SKELETON: No acute osseous process though bone windows have not been submitted. OTHER NECK: LEFT supraclavicular soft tissue swelling with moderate volume free fluid mass effect on undersurface LEFT sternocleidomastoid muscle. Edema extending into superior mediastinum. UPPER CHEST: Included lung apices are clear. No superior mediastinal lymphadenopathy. CTA HEAD FINDINGS: ANTERIOR CIRCULATION: Patent cervical internal carotid arteries, petrous, cavernous and supra clinoid internal carotid arteries. Patent anterior communicating artery. Patent anterior and middle cerebral arteries, mild luminal irregularity compatible with atherosclerosis. No large vessel occlusion, significant stenosis, contrast extravasation or aneurysm. POSTERIOR CIRCULATION: Patent vertebral arteries, vertebrobasilar junction and basilar artery, as well as main branch vessels. Patent posterior cerebral arteries, mild luminal irregularity compatible with atherosclerosis. No large vessel occlusion, significant stenosis, contrast extravasation or aneurysm. VENOUS SINUSES: Major dural venous sinuses are patent though not tailored for evaluation on this angiographic examination. ANATOMIC VARIANTS: Persistent RIGHT trigeminal artery. DELAYED PHASE: No abnormal intracranial enhancement. MIP images reviewed. IMPRESSION: CT  HEAD: 1. No acute intracranial process. 2. Chronic appearing small LEFT cerebellar infarcts. CTA NECK: 1. LEFT supraclavicular soft tissue swelling and effusion concerning for hemorrhage. No focal fluid collection or acute vascular injury. 2. Otherwise negative CTA NECK. CTA HEAD: 1. No emergent large vessel occlusion or flow-limiting stenosis. 2. Mild atherosclerosis. Electronically Signed   By: Elon Alas M.D.   On: 05/21/2018 02:02   Dg Chest 2 View  Result Date: 05/21/2018 CLINICAL DATA:  Short of  breath EXAM: CHEST - 2 VIEW COMPARISON:  01/17/2013 FINDINGS: The heart size and mediastinal contours are within normal limits. Both lungs are clear. The visualized skeletal structures are unremarkable. IMPRESSION: No active cardiopulmonary disease. Electronically Signed   By: Donavan Foil M.D.   On: 05/21/2018 02:11   Ct Angio Neck W And/or Wo Contrast  Result Date: 05/21/2018 CLINICAL DATA:  Neck pain and shortness of breath after removing sweater. EXAM: CT ANGIOGRAPHY HEAD AND NECK TECHNIQUE: Multidetector CT imaging of the head and neck was performed using the standard protocol during bolus administration of intravenous contrast. Multiplanar CT image reconstructions and MIPs were obtained to evaluate the vascular anatomy. Carotid stenosis measurements (when applicable) are obtained utilizing NASCET criteria, using the distal internal carotid diameter as the denominator. CONTRAST:  36m ISOVUE-370 IOPAMIDOL (ISOVUE-370) INJECTION 76% COMPARISON:  None. FINDINGS: CT HEAD FINDINGS BRAIN: No intraparenchymal hemorrhage, mass effect nor midline shift. The ventricles and sulci are normal. No acute large vascular territory infarcts. No abnormal extra-axial fluid collections. Chronic appearing small LEFT cerebellar infarcts. Basal cisterns are patent. VASCULAR: Unremarkable. SKULL/SOFT TISSUES: No skull fracture. Old nondisplaced bilateral nasal bone fractures. No significant soft tissue swelling. ORBITS/SINUSES: The included ocular globes and orbital contents are normal.Trace paranasal sinus mucosal thickening. Status post FESS. A mastoid air cells are well aerated. OTHER: None. CTA NECK FINDINGS: AORTIC ARCH: Normal appearance of the thoracic arch, normal branch pattern. The origins of the innominate, left Common carotid artery and subclavian artery are widely patent. RIGHT CAROTID SYSTEM: Common carotid artery is patent. Normal appearance of the carotid bifurcation without hemodynamically significant stenosis by  NASCET criteria. Normal appearance of the internal carotid artery. LEFT CAROTID SYSTEM: Common carotid artery is patent. Normal appearance of the carotid bifurcation without hemodynamically significant stenosis by NASCET criteria. Normal appearance of the internal carotid artery. VERTEBRAL ARTERIES:Left vertebral artery is dominant. Normal appearance of the vertebral arteries, widely patent. SKELETON: No acute osseous process though bone windows have not been submitted. OTHER NECK: LEFT supraclavicular soft tissue swelling with moderate volume free fluid mass effect on undersurface LEFT sternocleidomastoid muscle. Edema extending into superior mediastinum. UPPER CHEST: Included lung apices are clear. No superior mediastinal lymphadenopathy. CTA HEAD FINDINGS: ANTERIOR CIRCULATION: Patent cervical internal carotid arteries, petrous, cavernous and supra clinoid internal carotid arteries. Patent anterior communicating artery. Patent anterior and middle cerebral arteries, mild luminal irregularity compatible with atherosclerosis. No large vessel occlusion, significant stenosis, contrast extravasation or aneurysm. POSTERIOR CIRCULATION: Patent vertebral arteries, vertebrobasilar junction and basilar artery, as well as main branch vessels. Patent posterior cerebral arteries, mild luminal irregularity compatible with atherosclerosis. No large vessel occlusion, significant stenosis, contrast extravasation or aneurysm. VENOUS SINUSES: Major dural venous sinuses are patent though not tailored for evaluation on this angiographic examination. ANATOMIC VARIANTS: Persistent RIGHT trigeminal artery. DELAYED PHASE: No abnormal intracranial enhancement. MIP images reviewed. IMPRESSION: CT HEAD: 1. No acute intracranial process. 2. Chronic appearing small LEFT cerebellar infarcts. CTA NECK: 1. LEFT supraclavicular soft tissue swelling and effusion concerning for hemorrhage. No focal  fluid collection or acute vascular injury. 2.  Otherwise negative CTA NECK. CTA HEAD: 1. No emergent large vessel occlusion or flow-limiting stenosis. 2. Mild atherosclerosis. Electronically Signed   By: Elon Alas M.D.   On: 05/21/2018 02:02   Ct Chest W Contrast  Result Date: 05/21/2018 CLINICAL DATA:  Neck pain and shortness of breath after removing shirt tonight. Felt a pop. EXAM: CT CHEST WITH CONTRAST TECHNIQUE: Multidetector CT imaging of the chest was performed during intravenous contrast administration. CONTRAST:  48m OMNIPAQUE IOHEXOL 300 MG/ML  SOLN COMPARISON:  Chest radiograph May 21, 2018 and CT angiogram head and neck May 21, 2018. FINDINGS: CARDIOVASCULAR: Heart and pericardium are unremarkable. Thoracic aorta is normal course and caliber, unremarkable. MEDIASTINUM/NODES: Minimal effusion superior mediastinum. No mediastinal mass. No lymphadenopathy by CT size criteria. Normal appearance of thoracic esophagus though not tailored for evaluation. LUNGS/PLEURA: Tracheobronchial tree is patent, no pneumothorax. Small LEFT pleural effusion. Bibasilar atelectasis. UPPER ABDOMEN: Retroperitoneal and to lesser extent RIGHT upper quadrant fat stranding and effusion. Status post cholecystectomy. MUSCULOSKELETAL: LEFT supraclavicular effusion enlarged LEFT pectoralis minor and medial LEFT pectoralis major. LEFT supraclavicular soft tissue swelling with effusion, subcutaneous fat stranding. No focal fluid collections. Osseous structures are normal. IMPRESSION: 1. LEFT pectoralis major and minor strain (likely high-grade) with LEFT supraclavicular effusion compatible with blood products. 2. Small LEFT pleural effusion. 3. Included abdomen demonstrates small volume retroperitoneal and upper abdomen free fluid concerning for hemorrhage. Recommend CT abdomen and pelvis. 4. Acute findings discussed with and reconfirmed by PA LQuincy Carneson 05/21/2018 at 6:10 am. Electronically Signed   By: CElon AlasM.D.   On: 05/21/2018 06:13     Review of Systems  Constitutional: Negative for chills and fever.  HENT: Negative for hearing loss.   Eyes: Negative for blurred vision and double vision.  Respiratory: Negative for cough and shortness of breath.   Cardiovascular: Positive for chest pain and leg swelling. Negative for orthopnea and claudication.  Gastrointestinal: Negative for heartburn.       Early satiety and bloating  Genitourinary: Negative for dysuria.  Musculoskeletal: Positive for neck pain.  Skin: Negative.   Neurological: Negative for sensory change, speech change, focal weakness and loss of consciousness.  Endo/Heme/Allergies: Does not bruise/bleed easily.  Psychiatric/Behavioral: Negative.     Blood pressure (!) 113/54, pulse 66, temperature 97.6 F (36.4 C), temperature source Tympanic, resp. rate 19, height 5' 9"  (1.753 m), weight 83.9 kg, SpO2 96 %. Physical Exam  Constitutional: She is oriented to person, place, and time. She appears well-developed and well-nourished. No distress.  HENT:  Head: Normocephalic.  Right Ear: External ear normal.  Left Ear: External ear normal.  Nose: Nose normal.  Mouth/Throat: Oropharynx is clear and moist.  Eyes: Pupils are equal, round, and reactive to light. EOM are normal.  Neck: Neck supple. No tracheal deviation present. No thyromegaly present.  Cardiovascular: Normal rate, regular rhythm, normal heart sounds and intact distal pulses.  1+ peripheral edema  Respiratory: Effort normal and breath sounds normal. No respiratory distress. She has no wheezes. She has no rales.  GI: Soft. She exhibits no distension. There is no rebound and no guarding.  Tender right flank  Musculoskeletal:       Arms: Large tender hematoma left upper pectoralis muscle extending into her neck and across the midline  Neurological: She is alert and oriented to person, place, and time. She displays no tremor. She exhibits normal muscle tone. She displays no seizure activity. GCS eye  subscore  is 4. GCS verbal subscore is 5. GCS motor subscore is 6.  Skin: Skin is warm.  Psychiatric: She has a normal mood and affect.     Assessment/Plan Complex tear left pectoralis major and pectoralis minor -pain control, follow-up CBC, PT/OT eval  Retroperitoneal fluid with recent increasing edema bilateral lower extremities -I consulted Dr. Karmen Bongo for further work-up  Admit to trauma service for observation.  Zenovia Jarred, MD 05/21/2018, 9:18 AM

## 2018-05-21 NOTE — ED Triage Notes (Signed)
Pt reports neck pain and shortness of breath after taking her shirt off - reports she felt something pop. Slight swelling to L clavicle area. Near syncopal episode upon arrival to ED - diaphoretic. Denies hx of drug use, medical problems.

## 2018-05-22 ENCOUNTER — Observation Stay (HOSPITAL_COMMUNITY): Payer: BLUE CROSS/BLUE SHIELD

## 2018-05-22 DIAGNOSIS — R188 Other ascites: Secondary | ICD-10-CM

## 2018-05-22 DIAGNOSIS — S29011A Strain of muscle and tendon of front wall of thorax, initial encounter: Secondary | ICD-10-CM | POA: Diagnosis not present

## 2018-05-22 LAB — CBC
HEMATOCRIT: 37.8 % (ref 36.0–46.0)
Hemoglobin: 12.4 g/dL (ref 12.0–15.0)
MCH: 32.5 pg (ref 26.0–34.0)
MCHC: 32.8 g/dL (ref 30.0–36.0)
MCV: 99 fL (ref 78.0–100.0)
Platelets: 203 10*3/uL (ref 150–400)
RBC: 3.82 MIL/uL — ABNORMAL LOW (ref 3.87–5.11)
RDW: 12.6 % (ref 11.5–15.5)
WBC: 6.4 10*3/uL (ref 4.0–10.5)

## 2018-05-22 LAB — CA 125: Cancer Antigen (CA) 125: 5.1 U/mL (ref 0.0–38.1)

## 2018-05-22 LAB — BASIC METABOLIC PANEL
Anion gap: 3 — ABNORMAL LOW (ref 5–15)
BUN: 10 mg/dL (ref 6–20)
CALCIUM: 8.5 mg/dL — AB (ref 8.9–10.3)
CO2: 27 mmol/L (ref 22–32)
CREATININE: 0.95 mg/dL (ref 0.44–1.00)
Chloride: 111 mmol/L (ref 98–111)
GFR calc Af Amer: >60 mL/min (ref >60)
GFR calc non Af Amer: >60 mL/min (ref >60)
GLUCOSE: 106 mg/dL — AB (ref 70–99)
Potassium: 4.4 mmol/L (ref 3.5–5.1)
Sodium: 141 mmol/L (ref 135–145)

## 2018-05-22 LAB — ECHOCARDIOGRAM COMPLETE
Height: 68 in
WEIGHTICAEL: 3280 [oz_av]

## 2018-05-22 LAB — PROLACTIN: Prolactin: 19.4 ng/mL (ref 4.8–23.3)

## 2018-05-22 LAB — HEPATIC FUNCTION PANEL
ALT: 15 U/L (ref 0–44)
AST: 20 U/L (ref 15–41)
Albumin: 3.2 g/dL — ABNORMAL LOW (ref 3.5–5.0)
Alkaline Phosphatase: 75 U/L (ref 38–126)
BILIRUBIN DIRECT: 0.2 mg/dL (ref 0.0–0.2)
BILIRUBIN TOTAL: 0.9 mg/dL (ref 0.3–1.2)
Indirect Bilirubin: 0.7 mg/dL (ref 0.3–0.9)
Total Protein: 5.4 g/dL — ABNORMAL LOW (ref 6.5–8.1)

## 2018-05-22 LAB — BRAIN NATRIURETIC PEPTIDE: B NATRIURETIC PEPTIDE 5: 14 pg/mL (ref 0.0–100.0)

## 2018-05-22 LAB — HIV ANTIBODY (ROUTINE TESTING W REFLEX): HIV SCREEN 4TH GENERATION: NONREACTIVE

## 2018-05-22 MED ORDER — ENOXAPARIN SODIUM 40 MG/0.4ML ~~LOC~~ SOLN
40.0000 mg | SUBCUTANEOUS | Status: DC
  Administered 2018-05-22: 40 mg via SUBCUTANEOUS
  Filled 2018-05-22: qty 0.4

## 2018-05-22 MED ORDER — ACETAMINOPHEN 500 MG PO TABS: 1000.0000 mg | ORAL_TABLET | Freq: Four times a day (QID) | ORAL | 0 refills | Status: AC | PRN

## 2018-05-22 MED ORDER — ENSURE ENLIVE PO LIQD: 237.0000 mL | Freq: Two times a day (BID) | ORAL | Status: DC

## 2018-05-22 MED ORDER — ACETAMINOPHEN 500 MG PO TABS
1000.0000 mg | ORAL_TABLET | Freq: Three times a day (TID) | ORAL | Status: DC
  Administered 2018-05-22: 1000 mg via ORAL
  Filled 2018-05-22: qty 2

## 2018-05-22 NOTE — Social Work (Signed)
CSW met with pt and pt husband prior to discharge to complete SBIRT.  Pt up and dressed and ready to discharge, states she has had a good stay here at the hospital and feels ready to get home. Pt husband given verbal permission to stay in the room. Pt denies drinking prior to incident or consuming any substances. Pt does drink but denies any concerns or desire for any resources. Pt voices that she has had good care and feels happy with all her providers.   CSW signing off. Please consult if any additional needs arise.  [place], [place] Work [phone]

## 2018-05-22 NOTE — Progress Notes (Signed)
[facility] Progress Note     Subjective: CC:  States pain in left chest/neck are improving . Also endorses some mild pressure in the area.  C/o swelling in hands and feet for the past month. Reports flank pain started about two weeks ago and is becoming progressively worse. Again states she is keeping food down but having early satiety. Weight gain despite poor appetite.   Patient confirmed history of hysterectomy but states she does have ovaries - these were not visualized on pelvic ultrasound.   Objective: Vital signs in last 24 hours: Temp:  [98.1 F (36.7 C)-98.6 F (37 C)] 98.1 F (36.7 C) (08/22 0450) Pulse Rate:  [66-75] 67 (08/22 0450) Resp:  [13-20] 16 (08/22 0450) BP: (110-132)/(53-76) 132/68 (08/22 0450) SpO2:  [94 %-100 %] 97 % (08/22 0450) Weight:  [93 kg] 93 kg (08/21 1023) Last BM Date: 05/22/18  Intake/Output from previous day: 08/21 0701 - 08/22 0700 In: 1305.1 [P.O.:300; IV Piggyback:1005.1] Out: -  Intake/Output this shift: No intake/output data recorded.  PE: Gen:  Alert, NAD, pleasant and cooperative  Neck: no palpable lymph nodes  Card:  Regular rate and rhythm, pedal pulses 2+ BL, peripheral, non-pitting edema present  Pulm:  Normal effort, clear to auscultation bilaterally Abd: Soft, TTP R flank without overlying ecchymosis or skin changes, mild distention, bowel sounds present, no organomegaly, no appreciable inguinal lymph nodes MSK: hematoma over left upper pectoralis and neck. Appropriately tender.  Skin: warm and dry, no rashes  Psych: A&Ox3   Lab Results:  Recent Labs    05/21/18 1244 05/22/18 0513  WBC 7.3 6.4  HGB 14.2 12.4  HCT 42.4 37.8  PLT 217 203   BMET Recent Labs    05/21/18 0051 05/21/18 0057 05/22/18 0513  NA 138 137 PENDING  K 3.9 3.8 4.4  CL 103 102 PENDING  CO2 26  --  PENDING  GLUCOSE 138* 134* 106*  BUN 22* 23* 10  CREATININE 1.02* 1.00 0.95  CALCIUM 9.1  --  8.5*   PT/INR Recent Labs     05/21/18 0347  LABPROT 13.8  INR 1.07   CMP     Component Value Date/Time   NA PENDING 05/22/2018 0513   K 4.4 05/22/2018 0513   CL PENDING 05/22/2018 0513   CO2 PENDING 05/22/2018 0513   GLUCOSE 106 (H) 05/22/2018 0513   BUN 10 05/22/2018 0513   CREATININE 0.95 05/22/2018 0513   CREATININE 0.83 02/24/2013 1440   CALCIUM 8.5 (L) 05/22/2018 0513   PROT 6.8 11/23/2016 1619   ALBUMIN 4.4 11/23/2016 1619   AST 18 11/23/2016 1619   ALT 12 11/23/2016 1619   ALKPHOS 106 11/23/2016 1619   BILITOT 0.6 11/23/2016 1619   GFRNONAA >60 05/22/2018 0513   GFRAA >60 05/22/2018 0513   Lipase     Component Value Date/Time   LIPASE 31.0 11/23/2016 1619   Studies/Results: Ct Abdomen Pelvis Wo Contrast  Result Date: 05/21/2018 CLINICAL DATA:  Retroperitoneal fluid in the upper abdomen on chest CT. EXAM: CT ABDOMEN AND PELVIS WITHOUT CONTRAST TECHNIQUE: Multidetector CT imaging of the abdomen and pelvis was performed following the standard protocol without IV contrast. COMPARISON:  Chest CT earlier this day.  Abdominal CT 08/31/2014 FINDINGS: Lower chest: Small left pleural effusion. Bibasilar atelectasis. Dedicated chest CT performed immediately prior. Hepatobiliary: No focal abnormality. Clips in the gallbladder fossa postcholecystectomy. No biliary dilatation. Pancreas: Mild retroperitoneal stranding extends to the pancreatic head without discrete peripancreatic inflammatory change. No ductal dilatation. Spleen:  Normal in size without focal abnormality. Adrenals/Urinary Tract: No adrenal nodule. Mild bilateral perinephric edema, right greater than left. No hydronephrosis. Excreted IV contrast within both renal collecting systems, ureters and urinary bladder. Stomach/Bowel: Stomach physiologically distended. Retroperitoneal stranding extends to the duodenum. No duodenal wall thickening. No small or large bowel wall thickening or inflammatory change. Moderate stool throughout the colon. There is colonic  tortuosity including mesenteric swirling that was seen on prior exam. Appendix tentatively identified and normal. Vascular/Lymphatic: Retroperitoneal stranding and fluid, extending from the subdiaphragmatic region to the pelvis, fluid is greater on the right. Fluid measures simple fluid density. No abdominopelvic adenopathy. Reproductive: Status post hysterectomy. No adnexal masses. Other: Retroperitoneal haziness, stranding, and free fluid, right greater than left. Small amount of free fluid tracks in the pericolic gutters. No free air. Small fat containing umbilical hernia. Musculoskeletal: No acute fracture of the lumbar spine or bony pelvis. Vertebral body hemangioma within L1. IMPRESSION: 1. Moderate volume of retroperitoneal edema and free fluid, right greater than left, of uncertain etiology. Fluid measures simple fluid density and argues against hemorrhage. Greatest fluid volume appears adjacent to the right psoas muscle. Consider follow-up CT. 2. Colonic tortuosity, unchanged from CT 08/31/2014. No evidence of bowel inflammation. Electronically Signed   By: Jeb Levering M.D.   On: 05/21/2018 06:53   Ct Angio Head W Or Wo Contrast  Result Date: 05/21/2018 CLINICAL DATA:  Neck pain and shortness of breath after removing sweater. EXAM: CT ANGIOGRAPHY HEAD AND NECK TECHNIQUE: Multidetector CT imaging of the head and neck was performed using the standard protocol during bolus administration of intravenous contrast. Multiplanar CT image reconstructions and MIPs were obtained to evaluate the vascular anatomy. Carotid stenosis measurements (when applicable) are obtained utilizing NASCET criteria, using the distal internal carotid diameter as the denominator. CONTRAST:  66mL ISOVUE-370 IOPAMIDOL (ISOVUE-370) INJECTION 76% COMPARISON:  None. FINDINGS: CT HEAD FINDINGS BRAIN: No intraparenchymal hemorrhage, mass effect nor midline shift. The ventricles and sulci are normal. No acute large vascular territory  infarcts. No abnormal extra-axial fluid collections. Chronic appearing small LEFT cerebellar infarcts. Basal cisterns are patent. VASCULAR: Unremarkable. SKULL/SOFT TISSUES: No skull fracture. Old nondisplaced bilateral nasal bone fractures. No significant soft tissue swelling. ORBITS/SINUSES: The included ocular globes and orbital contents are normal.Trace paranasal sinus mucosal thickening. Status post FESS. A mastoid air cells are well aerated. OTHER: None. CTA NECK FINDINGS: AORTIC ARCH: Normal appearance of the thoracic arch, normal branch pattern. The origins of the innominate, left Common carotid artery and subclavian artery are widely patent. RIGHT CAROTID SYSTEM: Common carotid artery is patent. Normal appearance of the carotid bifurcation without hemodynamically significant stenosis by NASCET criteria. Normal appearance of the internal carotid artery. LEFT CAROTID SYSTEM: Common carotid artery is patent. Normal appearance of the carotid bifurcation without hemodynamically significant stenosis by NASCET criteria. Normal appearance of the internal carotid artery. VERTEBRAL ARTERIES:Left vertebral artery is dominant. Normal appearance of the vertebral arteries, widely patent. SKELETON: No acute osseous process though bone windows have not been submitted. OTHER NECK: LEFT supraclavicular soft tissue swelling with moderate volume free fluid mass effect on undersurface LEFT sternocleidomastoid muscle. Edema extending into superior mediastinum. UPPER CHEST: Included lung apices are clear. No superior mediastinal lymphadenopathy. CTA HEAD FINDINGS: ANTERIOR CIRCULATION: Patent cervical internal carotid arteries, petrous, cavernous and supra clinoid internal carotid arteries. Patent anterior communicating artery. Patent anterior and middle cerebral arteries, mild luminal irregularity compatible with atherosclerosis. No large vessel occlusion, significant stenosis, contrast extravasation or aneurysm. POSTERIOR  CIRCULATION: Patent vertebral  arteries, vertebrobasilar junction and basilar artery, as well as main branch vessels. Patent posterior cerebral arteries, mild luminal irregularity compatible with atherosclerosis. No large vessel occlusion, significant stenosis, contrast extravasation or aneurysm. VENOUS SINUSES: Major dural venous sinuses are patent though not tailored for evaluation on this angiographic examination. ANATOMIC VARIANTS: Persistent RIGHT trigeminal artery. DELAYED PHASE: No abnormal intracranial enhancement. MIP images reviewed. IMPRESSION: CT HEAD: 1. No acute intracranial process. 2. Chronic appearing small LEFT cerebellar infarcts. CTA NECK: 1. LEFT supraclavicular soft tissue swelling and effusion concerning for hemorrhage. No focal fluid collection or acute vascular injury. 2. Otherwise negative CTA NECK. CTA HEAD: 1. No emergent large vessel occlusion or flow-limiting stenosis. 2. Mild atherosclerosis. Electronically Signed   By: Elon Alas M.D.   On: 05/21/2018 02:02   Dg Chest 2 View  Result Date: 05/21/2018 CLINICAL DATA:  Short of breath EXAM: CHEST - 2 VIEW COMPARISON:  01/17/2013 FINDINGS: The heart size and mediastinal contours are within normal limits. Both lungs are clear. The visualized skeletal structures are unremarkable. IMPRESSION: No active cardiopulmonary disease. Electronically Signed   By: Donavan Foil M.D.   On: 05/21/2018 02:11   Ct Angio Neck W And/or Wo Contrast  Result Date: 05/21/2018 CLINICAL DATA:  Neck pain and shortness of breath after removing sweater. EXAM: CT ANGIOGRAPHY HEAD AND NECK TECHNIQUE: Multidetector CT imaging of the head and neck was performed using the standard protocol during bolus administration of intravenous contrast. Multiplanar CT image reconstructions and MIPs were obtained to evaluate the vascular anatomy. Carotid stenosis measurements (when applicable) are obtained utilizing NASCET criteria, using the distal internal carotid  diameter as the denominator. CONTRAST:  51mL ISOVUE-370 IOPAMIDOL (ISOVUE-370) INJECTION 76% COMPARISON:  None. FINDINGS: CT HEAD FINDINGS BRAIN: No intraparenchymal hemorrhage, mass effect nor midline shift. The ventricles and sulci are normal. No acute large vascular territory infarcts. No abnormal extra-axial fluid collections. Chronic appearing small LEFT cerebellar infarcts. Basal cisterns are patent. VASCULAR: Unremarkable. SKULL/SOFT TISSUES: No skull fracture. Old nondisplaced bilateral nasal bone fractures. No significant soft tissue swelling. ORBITS/SINUSES: The included ocular globes and orbital contents are normal.Trace paranasal sinus mucosal thickening. Status post FESS. A mastoid air cells are well aerated. OTHER: None. CTA NECK FINDINGS: AORTIC ARCH: Normal appearance of the thoracic arch, normal branch pattern. The origins of the innominate, left Common carotid artery and subclavian artery are widely patent. RIGHT CAROTID SYSTEM: Common carotid artery is patent. Normal appearance of the carotid bifurcation without hemodynamically significant stenosis by NASCET criteria. Normal appearance of the internal carotid artery. LEFT CAROTID SYSTEM: Common carotid artery is patent. Normal appearance of the carotid bifurcation without hemodynamically significant stenosis by NASCET criteria. Normal appearance of the internal carotid artery. VERTEBRAL ARTERIES:Left vertebral artery is dominant. Normal appearance of the vertebral arteries, widely patent. SKELETON: No acute osseous process though bone windows have not been submitted. OTHER NECK: LEFT supraclavicular soft tissue swelling with moderate volume free fluid mass effect on undersurface LEFT sternocleidomastoid muscle. Edema extending into superior mediastinum. UPPER CHEST: Included lung apices are clear. No superior mediastinal lymphadenopathy. CTA HEAD FINDINGS: ANTERIOR CIRCULATION: Patent cervical internal carotid arteries, petrous, cavernous and  supra clinoid internal carotid arteries. Patent anterior communicating artery. Patent anterior and middle cerebral arteries, mild luminal irregularity compatible with atherosclerosis. No large vessel occlusion, significant stenosis, contrast extravasation or aneurysm. POSTERIOR CIRCULATION: Patent vertebral arteries, vertebrobasilar junction and basilar artery, as well as main branch vessels. Patent posterior cerebral arteries, mild luminal irregularity compatible with atherosclerosis. No large vessel occlusion, significant stenosis,  contrast extravasation or aneurysm. VENOUS SINUSES: Major dural venous sinuses are patent though not tailored for evaluation on this angiographic examination. ANATOMIC VARIANTS: Persistent RIGHT trigeminal artery. DELAYED PHASE: No abnormal intracranial enhancement. MIP images reviewed. IMPRESSION: CT HEAD: 1. No acute intracranial process. 2. Chronic appearing small LEFT cerebellar infarcts. CTA NECK: 1. LEFT supraclavicular soft tissue swelling and effusion concerning for hemorrhage. No focal fluid collection or acute vascular injury. 2. Otherwise negative CTA NECK. CTA HEAD: 1. No emergent large vessel occlusion or flow-limiting stenosis. 2. Mild atherosclerosis. Electronically Signed   By: Elon Alas M.D.   On: 05/21/2018 02:02   Ct Chest W Contrast  Result Date: 05/21/2018 CLINICAL DATA:  Neck pain and shortness of breath after removing shirt tonight. Felt a pop. EXAM: CT CHEST WITH CONTRAST TECHNIQUE: Multidetector CT imaging of the chest was performed during intravenous contrast administration. CONTRAST:  29mL OMNIPAQUE IOHEXOL 300 MG/ML  SOLN COMPARISON:  Chest radiograph May 21, 2018 and CT angiogram head and neck May 21, 2018. FINDINGS: CARDIOVASCULAR: Heart and pericardium are unremarkable. Thoracic aorta is normal course and caliber, unremarkable. MEDIASTINUM/NODES: Minimal effusion superior mediastinum. No mediastinal mass. No lymphadenopathy by CT size  criteria. Normal appearance of thoracic esophagus though not tailored for evaluation. LUNGS/PLEURA: Tracheobronchial tree is patent, no pneumothorax. Small LEFT pleural effusion. Bibasilar atelectasis. UPPER ABDOMEN: Retroperitoneal and to lesser extent RIGHT upper quadrant fat stranding and effusion. Status post cholecystectomy. MUSCULOSKELETAL: LEFT supraclavicular effusion enlarged LEFT pectoralis minor and medial LEFT pectoralis major. LEFT supraclavicular soft tissue swelling with effusion, subcutaneous fat stranding. No focal fluid collections. Osseous structures are normal. IMPRESSION: 1. LEFT pectoralis major and minor strain (likely high-grade) with LEFT supraclavicular effusion compatible with blood products. 2. Small LEFT pleural effusion. 3. Included abdomen demonstrates small volume retroperitoneal and upper abdomen free fluid concerning for hemorrhage. Recommend CT abdomen and pelvis. 4. Acute findings discussed with and reconfirmed by PA Quincy Carnes on 05/21/2018 at 6:10 am. Electronically Signed   By: Elon Alas M.D.   On: 05/21/2018 06:13   US Pelvic Complete With Transvaginal  Result Date: 05/21/2018 CLINICAL DATA:  The patient has a retroperitoneal fluid collection posterior to the right kidney seen on CT scan of today's date. The patient has undergone previous hysterectomy. EXAM: TRANSABDOMINAL AND TRANSVAGINAL ULTRASOUND OF PELVIS TECHNIQUE: Both transabdominal and transvaginal ultrasound examinations of the pelvis were performed. Transabdominal technique was performed for global imaging of the pelvis including uterus, ovaries, adnexal regions, and pelvic cul-de-sac. It was necessary to proceed with endovaginal exam following the transabdominal exam to visualize the abdominal and pelvic CT scan of today's date. COMPARISON:  None FINDINGS: Uterus The uterus is surgically absent. Right ovary The right ovary was not visualized. Left ovary The left ovary was not visualized. Other findings  There is a tiny amount of free pelvic fluid. IMPRESSION: No adnexal masses are observed. The ovaries were not visualized. The uterus is surgically absent. Small amount of free pelvic fluid. Electronically Signed   By: David  Martinique M.D.   On: 05/21/2018 12:24    Anti-infectives: Anti-infectives (From admission, onward)   None     Assessment/Plan Complex tear left pectoralis major and pectoralis minor - pain control, CBC stable Retroperitoneal fluid, unknown etiology - associated flank pain and LE edema; appreciate Dr. Lorin Mercy CA 125 and prolactin WNL. Pelvis U/S with absent uterus/ovaris, small amt free pelvic fluid  FEN - Reg diet ID - none VTE - SCD's, Lovenox held 2/2 hematoma over L pectoralis; ok to start  chemical VTE today Dispo - will discuss with the hospitalist today - workup for pelvic malignancy negative so far but still no explanation for patient symptoms of early satiety, edema, and etiology of retroperitoneal fluid/flank pain.    From a trauma perspective, patient is medically stable for discharge.     LOS: 0 days    Obie Dredge, [facility] Surgery Pager: [phone]

## 2018-05-22 NOTE — Progress Notes (Signed)
Discharge home. Home discharge instruction given, no question verbalized. 

## 2018-05-22 NOTE — Plan of Care (Signed)

## 2018-05-22 NOTE — Progress Notes (Signed)
OT Cancellation Note  Patient Details Name: Elizabeth Dominguez MRN: [id number] DOB: 11/23/1960   Cancelled Treatment:    Reason Eval/Treat Not Completed: OT screened, no needs identified, will sign off. Patient screened and reports greatly improved ROM and pain control to L UE, shoulder and neck after application of ice.  At this time, no ROM restrictions, ADL impairments, or OT needs identified and OT signing off.  If any changes arise, please re-consult. Thank you!   Delight Stare, OTR/L  Pager [place] 05/22/2018, 10:30 AM

## 2018-05-22 NOTE — Progress Notes (Signed)
  Echocardiogram 2D Echocardiogram has been performed.  Elizabeth Dominguez M 05/22/2018, 11:42 AM

## 2018-05-22 NOTE — Progress Notes (Signed)
PROGRESS NOTE  Elizabeth Dominguez IDP:[id number] DOB: 1961/07/04 DOA: 05/21/2018 PCP: Hulan Fess, MD  HPI/Recap of past 24 hours: Elizabeth Dominguez is an 57 y.o. female with PMH sig for depression, anxiety, kidney stone, who presented with a marked pectoralis tear after taking her shirt off. Few mins later, pt noted her neck/upper L chest was swollen and was finding it difficult to breath. Pt also had multiple chronic complaints including a hx of intermittent LE edema ongoing for about 6 weeks, about 7 pounds unexpected weight gain in 6 weeks, abdominal fullness/bloating with some mild discomfort around her R flank/LQ area, early satiety, hot flashes. Surgery was asked to admit due to marked pectoralis tear. Due to all the other symptoms, hospitalist were consulted for further management.  Today, pt reported improved swelling and tenderness around her neck/L upper chest. Denies any new complaints. Still reports bloating, with some mild R flank discomfort. Labs test and imaging reassuring. Pt stable to be discharged from Medicine stand point. Medicine will sign off.  Assessment/Plan: Principal Problem:   Strain of left pectoralis muscle Active Problems:   Retroperitoneal fluid collection   Hyperglycemia  Pectoralis strain with hematoma Improved Trauma surgery to follow up  Abdominal bloating/early satiety/retroperitoneal fluid collection Rule out any pelvic/abdominal malignancy, Hx of hysterectomy, reports ovaries were preserved CT abd/pelvis was nonspecific about this fluid collection - moderate volume of retroperitoneal edema and free fluid, R>L, uncertain etiology. No adnexal masses seen  USS pelvis showed: no adnexal masses are observed. The ovaries were not visualized. The uterus is surgically absent LFTs WNL CA-125 WNL, prolactin WNL Admitting physician spoke with Dr. Matthew Saras (obgyn) who recommends CA-125 and pelvic US; if negative, he does not recommend further gynecologic  work-up Pt should follow up with PCP in 1 week for follow up. Plan to repeat CT abdomen/pelvis in about 1 month or sooner if symptoms worsens  Intermittent BLE edema/SOB BNP WNL CXR unremarkable ECHO with normal EF, no RWMA Follow up with PCP in 1 week    Code Status: Full  Family Communication: Husband at bedside, discussed medical management in details  Disposition Plan: Discharge back home   Consultants:  Hospitalist  Procedures:  None  Antimicrobials:  None  DVT prophylaxis: SCDs   Objective: Vitals:   05/21/18 2300 05/22/18 0450 05/22/18 1010 05/22/18 1346  BP: 110/63 132/68 113/67 124/61  Pulse: 75 67 98 72  Resp: 16 16 16 16   Temp: 98.3 F (36.8 C) 98.1 F (36.7 C) 97.7 F (36.5 C) 98 F (36.7 C)  TempSrc: Oral Oral Oral Oral  SpO2: 94% 97% 100% 100%  Weight:      Height:        Intake/Output Summary (Last 24 hours) at 05/22/2018 1435 Last data filed at 05/22/2018 0910 Gross per 24 hour  Intake 660 ml  Output -  Net 660 ml   Filed Weights   05/21/18 0026 05/21/18 1023  Weight: 83.9 kg 93 kg    Exam:   General: NAD,   Cardiovascular: S1, S2 present  Respiratory: CTA B  Abdomen: Soft, nontender, bloated, bowel sounds present  Musculoskeletal: N bilateral pedal edema present  Skin: Normal  Psychiatry: Normal mood   Data Reviewed: CBC: Recent Labs  Lab 05/21/18 0051 05/21/18 0057 05/21/18 1244 05/22/18 0513  WBC 8.7  --  7.3 6.4  HGB 13.9 13.6 14.2 12.4  HCT 40.7 40.0 42.4 37.8  MCV 96.2  --  97.0 99.0  PLT 207  --  217 203  Basic Metabolic Panel: Recent Labs  Lab 05/21/18 0051 05/21/18 0057 05/22/18 0513  NA 138 137 141  K 3.9 3.8 4.4  CL 103 102 111  CO2 26  --  27  GLUCOSE 138* 134* 106*  BUN 22* 23* 10  CREATININE 1.02* 1.00 0.95  CALCIUM 9.1  --  8.5*   GFR: Estimated Creatinine Clearance: 78.8 mL/min (by C-G formula based on SCr of 0.95 mg/dL). Liver Function Tests: Recent Labs  Lab  05/22/18 0513  AST 20  ALT 15  ALKPHOS 75  BILITOT 0.9  PROT 5.4*  ALBUMIN 3.2*   No results for input(s): LIPASE, AMYLASE in the last 168 hours. No results for input(s): AMMONIA in the last 168 hours. Coagulation Profile: Recent Labs  Lab 05/21/18 0347  INR 1.07   Cardiac Enzymes: Recent Labs  Lab 05/21/18 0051  TROPONINI <0.03   BNP (last 3 results) No results for input(s): PROBNP in the last 8760 hours. HbA1C: Recent Labs    05/21/18 1142  HGBA1C 5.5   CBG: Recent Labs  Lab 05/21/18 0031  GLUCAP 118*   Lipid Profile: No results for input(s): CHOL, HDL, LDLCALC, TRIG, CHOLHDL, LDLDIRECT in the last 72 hours. Thyroid Function Tests: No results for input(s): TSH, T4TOTAL, FREET4, T3FREE, THYROIDAB in the last 72 hours. Anemia Panel: No results for input(s): VITAMINB12, FOLATE, FERRITIN, TIBC, IRON, RETICCTPCT in the last 72 hours. Urine analysis:    Component Value Date/Time   COLORURINE YELLOW 05/21/2018 0226   APPEARANCEUR CLEAR 05/21/2018 0226   LABSPEC 1.039 (H) 05/21/2018 0226   PHURINE 6.0 05/21/2018 0226   GLUCOSEU NEGATIVE 05/21/2018 0226   HGBUR SMALL (A) 05/21/2018 0226   BILIRUBINUR NEGATIVE 05/21/2018 0226   KETONESUR NEGATIVE 05/21/2018 0226   PROTEINUR NEGATIVE 05/21/2018 0226   UROBILINOGEN 0.2 01/15/2013 1733   NITRITE NEGATIVE 05/21/2018 0226   LEUKOCYTESUR NEGATIVE 05/21/2018 0226   Sepsis Labs: @LABRCNTIP (procalcitonin:4,lacticidven:4)  )No results found for this or any previous visit (from the past 240 hour(s)).    Studies: No results found.  Scheduled Meds: . acetaminophen  1,000 mg Oral Q8H  . DULoxetine  30 mg Oral Daily  . enoxaparin (LOVENOX) injection  40 mg Subcutaneous Q24H  . feeding supplement (ENSURE ENLIVE)  237 mL Oral BID BM  . magnesium gluconate  500 mg Oral Daily  . multivitamin with minerals   Oral Daily    Continuous Infusions:   LOS: 0 days     Alma Friendly, MD [facility]   If  7PM-7AM, please contact night-coverage [email or web address] Password [facility] 05/22/2018, 2:35 PM

## 2018-05-27 NOTE — Discharge Summary (Addendum)
Elizabeth Dominguez Surgery Discharge Summary   Patient ID: Elizabeth Dominguez MRN: [id number] DOB/AGE: 57-Nov-1962 57 y.o.  Admit date: 05/21/2018 Discharge date: 05/22/2018 Discharge Diagnosis Patient Active Problem List   Diagnosis Date Noted  . Strain of left pectoralis muscle 05/21/2018  . Retroperitoneal fluid collection 05/21/2018  . Hyperglycemia 05/21/2018  . Symptomatic cholelithiasis 01/13/2013  . Umbilical hernia, incarcerated 10/15/2011    Consultants [facility]  Imaging: No results found.  Procedures None   HPI:  [name] was trying to remove her shirt when she felt a popping or tearing sensation in her left upper chest.  She had immediate pain in the area.  She gradually developed increasing swelling in the area and it became a little bit difficult to swallow.  She could not get comfortable when she tried to go to sleep and she came to the emergency room. Of note, she reports 33-month history of leg swelling and a sensation of abdominal fullness and early satiety.   Hospital Course:  CT scan of the chest was done demonstrating a significant tear of her pectoralis major and pectoralis minor musculature with resultant hematoma.  Additionally some evidence of retroperitoneal fluid was seen so she went on to get further evaluation with CT scan of the abdomen and pelvis. Patient was admitted for pain control and hemodynamic monitoring. Hospitalist was consulted for further workup of retroperitoneal fluid and lower extremity edema unknown etiology. Workup for GYN malignancy (pelvic US, prolactin, and CA-125) negative and cardiac workup (BNP, CXR, echo) negative. On 05/22/18 the patients pectoralis pain was controlled, she was hemodynamically stable, tolerating PO, mobilizing, and medically stable for discharge. She will require outpatient follow up with PCP as below for monitoring of edema and scheduling repeat CT abd/pelvis for re-evaluation retroperitoneal  fluid.  Allergies as of 05/22/2018      Reactions   Ibuprofen Other (See Comments)   Abdominal pain   Dilaudid [hydromorphone Hcl] Itching, Rash      Medication List    TAKE these medications   acetaminophen 500 MG tablet Commonly known as:  TYLENOL Take 2 tablets (1,000 mg total) by mouth every 6 (six) hours as needed.   BREATHE RIGHT EXTRA Strp 1 each by Does not apply route at bedtime.   docusate sodium 100 MG capsule Commonly known as:  COLACE Take 100 mg by mouth daily as needed for mild constipation.   DULoxetine 30 MG capsule Commonly known as:  CYMBALTA Take 30 mg by mouth daily.   magnesium gluconate 500 MG tablet Commonly known as:  MAGONATE Take 500 mg by mouth daily.   MULTIPLE VITAMIN PO Take 1 tablet by mouth daily. Hair, skin and nail gummy   pseudoephedrine 60 MG tablet Commonly known as:  SUDAFED Take 60 mg by mouth every 4 (four) hours as needed for congestion.      Follow-up Information    Little, Lennette Bihari, MD. Schedule an appointment as soon as possible for a visit in 1 week(s).   Specialty:  Family Medicine Contact information: [place] [address] [phone]           Signed: Obie Dredge, [facility] 05/27/2018, 2:33 PM

## 2020-07-03 IMAGING — CT CT ANGIO NECK
2 of 12 series · 8 of 33 positions shown · IV contrast (OMNI 350)
Comparison: None.

CLINICAL DATA: Neck pain and shortness of breath after removing
sweater.

EXAM:
CT ANGIOGRAPHY HEAD AND NECK
TECHNIQUE: Multidetector CT imaging of the head and neck was performed using
the standard protocol during bolus administration of intravenous
contrast. Multiplanar CT image reconstructions and MIPs were
obtained to evaluate the vascular anatomy. Carotid stenosis
measurements (when applicable) are obtained utilizing NASCET
criteria, using the distal internal carotid diameter as the
denominator.
CONTRAST:  50mL UJD2L4-LNA IOPAMIDOL (UJD2L4-LNA) INJECTION 76%

[Series 9: cta neck · axial · 0.42mm/px · z∈[-308,+40]mm · 3 of 175 slices shown]
[im 1/175  soft-tissue]
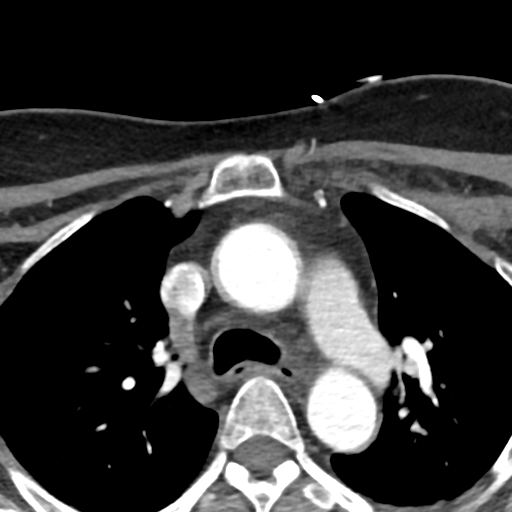
[im 88/175  bone]
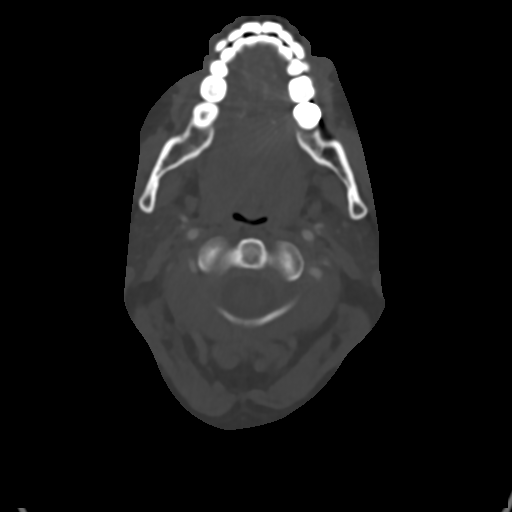
[im 175/175  soft-tissue]
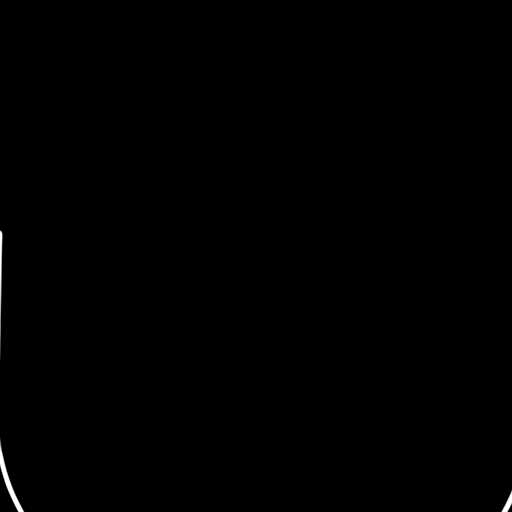

[Series 11: cta neck axial · axial · 0.33mm/px · z∈[-291,-67]mm · 5 of 362 slices shown]
[im 61/362  soft-tissue]
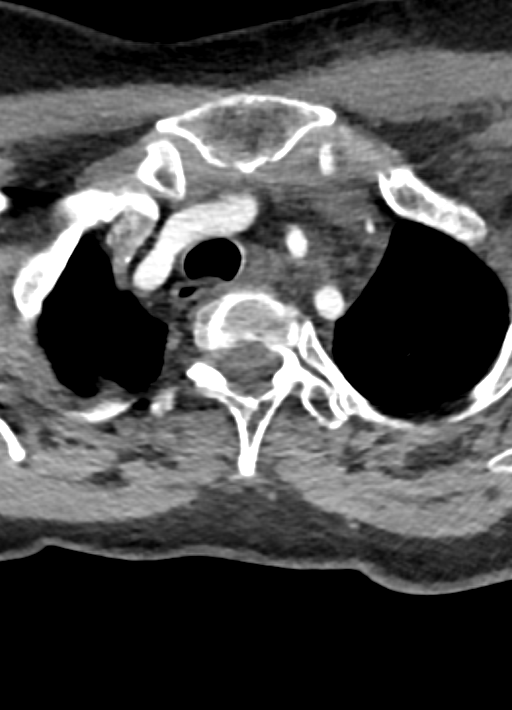
[im 121/362  soft-tissue]
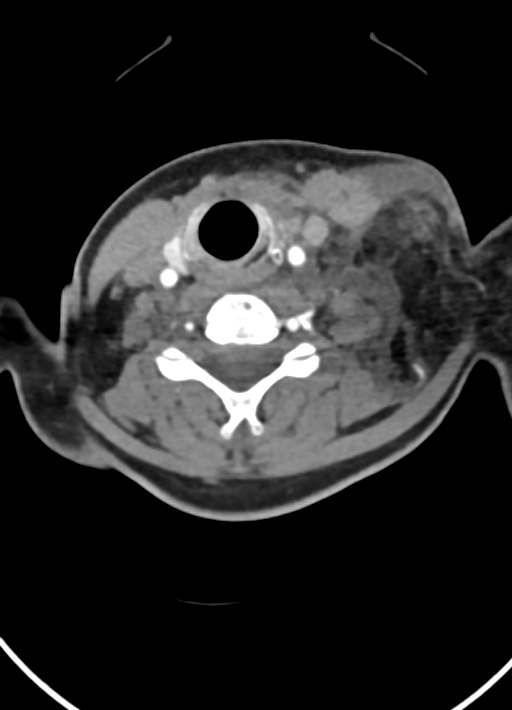
[im 181/362  soft-tissue]
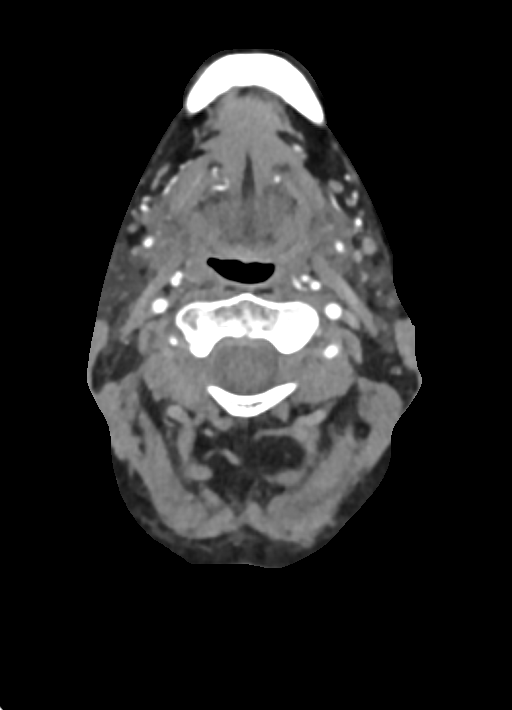
[im 241/362  soft-tissue]
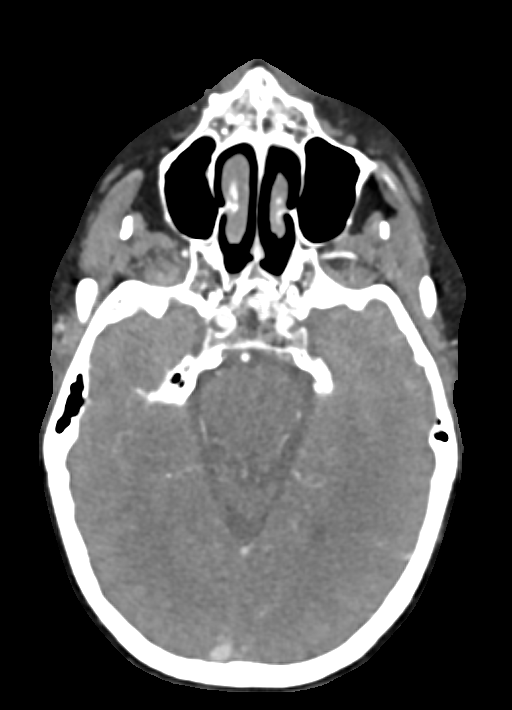
[im 301/362  soft-tissue]
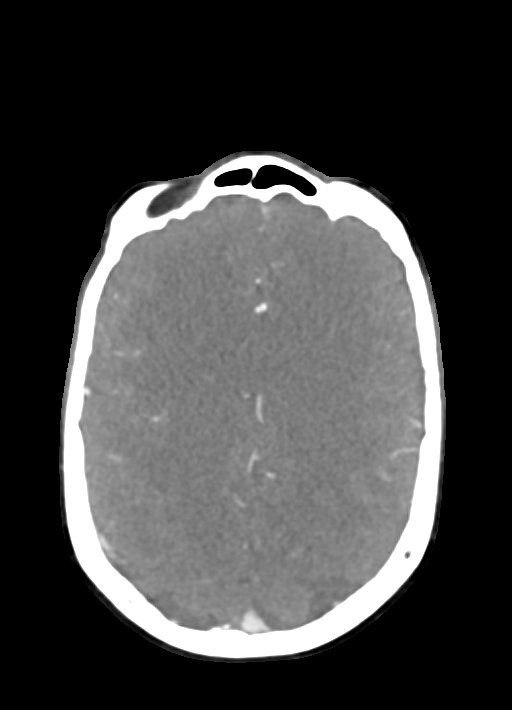

[8 of 33 positions shown; findings below may reference images not displayed]

FINDINGS: CT HEAD FINDINGS

BRAIN: No intraparenchymal hemorrhage, mass effect nor midline
shift. The ventricles and sulci are normal. No acute large vascular
territory infarcts. No abnormal extra-axial fluid collections.
Chronic appearing small LEFT cerebellar infarcts. Basal cisterns are
patent.

VASCULAR: Unremarkable.

SKULL/SOFT TISSUES: No skull fracture. Old nondisplaced bilateral
nasal bone fractures. No significant soft tissue swelling.

ORBITS/SINUSES: The included ocular globes and orbital contents are
normal.Trace paranasal sinus mucosal thickening. Status post FESS. A
mastoid air cells are well aerated.

OTHER: None.

CTA NECK FINDINGS:

AORTIC ARCH: Normal appearance of the thoracic arch, normal branch
pattern. The origins of the innominate, left Common carotid artery
and subclavian artery are widely patent.

RIGHT CAROTID SYSTEM: Common carotid artery is patent. Normal
appearance of the carotid bifurcation without hemodynamically
significant stenosis by NASCET criteria. Normal appearance of the
internal carotid artery.

LEFT CAROTID SYSTEM: Common carotid artery is patent. Normal
appearance of the carotid bifurcation without hemodynamically
significant stenosis by NASCET criteria. Normal appearance of the
internal carotid artery.

VERTEBRAL ARTERIES:Left vertebral artery is dominant. Normal
appearance of the vertebral arteries, widely patent.

SKELETON: No acute osseous process though bone windows have not been
submitted.

OTHER NECK: LEFT supraclavicular soft tissue swelling with moderate
volume free fluid mass effect on undersurface LEFT
sternocleidomastoid muscle. Edema extending into superior
mediastinum.

UPPER CHEST: Included lung apices are clear. No superior mediastinal
lymphadenopathy.

CTA HEAD FINDINGS:

ANTERIOR CIRCULATION: Patent cervical internal carotid arteries,
petrous, cavernous and supra clinoid internal carotid arteries.
Patent anterior communicating artery. Patent anterior and middle
cerebral arteries, mild luminal irregularity compatible with
atherosclerosis.

No large vessel occlusion, significant stenosis, contrast
extravasation or aneurysm.

POSTERIOR CIRCULATION: Patent vertebral arteries, vertebrobasilar
junction and basilar artery, as well as main branch vessels. Patent
posterior cerebral arteries, mild luminal irregularity compatible
with atherosclerosis.

No large vessel occlusion, significant stenosis, contrast
extravasation or aneurysm.

VENOUS SINUSES: Major dural venous sinuses are patent though not
tailored for evaluation on this angiographic examination.

ANATOMIC VARIANTS: Persistent RIGHT trigeminal artery.

DELAYED PHASE: No abnormal intracranial enhancement.

MIP images reviewed.
IMPRESSION: CT HEAD:

1. No acute intracranial process.
2. Chronic appearing small LEFT cerebellar infarcts.

CTA NECK:

1. LEFT supraclavicular soft tissue swelling and effusion concerning
for hemorrhage. No focal fluid collection or acute vascular injury.
2. Otherwise negative CTA NECK.

CTA HEAD:

1. No emergent large vessel occlusion or flow-limiting stenosis.
2. Mild atherosclerosis.
